# Patient Record
Sex: Male | Born: 1985 | Race: Black or African American | Hispanic: No | Marital: Single | Smoking: Current every day smoker
Health system: Southern US, Community
[De-identification: ages and names within clinical notes are randomized; demographics above are authoritative.]

## PROBLEM LIST (undated history)

## (undated) ENCOUNTER — Emergency Department (HOSPITAL_COMMUNITY): Payer: Medicare Other | Source: Home / Self Care

## (undated) DIAGNOSIS — H9190 Unspecified hearing loss, unspecified ear: Secondary | ICD-10-CM

## (undated) DIAGNOSIS — F1910 Other psychoactive substance abuse, uncomplicated: Secondary | ICD-10-CM

## (undated) DIAGNOSIS — F1010 Alcohol abuse, uncomplicated: Secondary | ICD-10-CM

## (undated) HISTORY — PX: KNEE SURGERY: SHX244

## (undated) HISTORY — PX: TESTICLE TORSION REDUCTION: SHX795

## (undated) HISTORY — PX: ABDOMINAL SURGERY: SHX537

---
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
## 2006-08-06 ENCOUNTER — Ambulatory Visit (HOSPITAL_COMMUNITY): Admission: EM | Admit: 2006-08-06 | Discharge: 2006-08-07 | Payer: Self-pay | Admitting: Emergency Medicine

## 2008-05-16 ENCOUNTER — Emergency Department (HOSPITAL_COMMUNITY): Admission: EM | Admit: 2008-05-16 | Discharge: 2008-05-16 | Payer: Self-pay | Admitting: Emergency Medicine

## 2009-02-02 ENCOUNTER — Emergency Department (HOSPITAL_COMMUNITY): Admission: EM | Admit: 2009-02-02 | Discharge: 2009-02-02 | Payer: Self-pay | Admitting: Family Medicine

## 2009-03-01 ENCOUNTER — Emergency Department (HOSPITAL_COMMUNITY): Admission: EM | Admit: 2009-03-01 | Discharge: 2009-03-01 | Payer: Self-pay | Admitting: Emergency Medicine

## 2009-05-27 ENCOUNTER — Encounter: Payer: Self-pay | Admitting: Emergency Medicine

## 2009-05-28 ENCOUNTER — Ambulatory Visit: Payer: Self-pay | Admitting: Psychiatry

## 2009-05-28 ENCOUNTER — Inpatient Hospital Stay (HOSPITAL_COMMUNITY): Admission: EM | Admit: 2009-05-28 | Discharge: 2009-05-31 | Payer: Self-pay | Admitting: Psychiatry

## 2009-06-23 ENCOUNTER — Emergency Department (HOSPITAL_COMMUNITY): Admission: EM | Admit: 2009-06-23 | Discharge: 2009-06-23 | Payer: Self-pay | Admitting: Emergency Medicine

## 2009-06-23 ENCOUNTER — Ambulatory Visit (HOSPITAL_COMMUNITY): Admission: RE | Admit: 2009-06-23 | Discharge: 2009-06-23 | Payer: Self-pay | Admitting: Psychiatry

## 2009-12-22 ENCOUNTER — Emergency Department (HOSPITAL_COMMUNITY): Admission: EM | Admit: 2009-12-22 | Discharge: 2009-12-22 | Payer: Self-pay | Admitting: Emergency Medicine

## 2009-12-26 ENCOUNTER — Inpatient Hospital Stay (HOSPITAL_COMMUNITY): Admission: AD | Admit: 2009-12-26 | Discharge: 2009-12-28 | Payer: Self-pay | Admitting: Urology

## 2009-12-26 ENCOUNTER — Emergency Department (HOSPITAL_COMMUNITY): Admission: EM | Admit: 2009-12-26 | Discharge: 2009-12-26 | Payer: Self-pay | Admitting: Family Medicine

## 2010-10-10 ENCOUNTER — Emergency Department (HOSPITAL_COMMUNITY)
Admission: EM | Admit: 2010-10-10 | Discharge: 2010-10-11 | Disposition: A | Discharge status: Home / Self Care | Payer: Medicare Other | Attending: Emergency Medicine | Admitting: Emergency Medicine

## 2010-10-10 DIAGNOSIS — H9190 Unspecified hearing loss, unspecified ear: Secondary | ICD-10-CM | POA: Insufficient documentation

## 2010-10-10 DIAGNOSIS — F1210 Cannabis abuse, uncomplicated: Secondary | ICD-10-CM | POA: Insufficient documentation

## 2010-10-10 DIAGNOSIS — F1910 Other psychoactive substance abuse, uncomplicated: Secondary | ICD-10-CM | POA: Insufficient documentation

## 2010-10-10 LAB — RAPID URINE DRUG SCREEN, HOSP PERFORMED
Barbiturates: NOT DETECTED
Cocaine: NOT DETECTED
Opiates: NOT DETECTED

## 2010-10-10 LAB — CBC
MCHC: 34.3 g/dL (ref 30.0–36.0)
Platelets: 149 10*3/uL — ABNORMAL LOW (ref 150–400)

## 2010-10-10 LAB — URINALYSIS, ROUTINE W REFLEX MICROSCOPIC
Ketones, ur: NEGATIVE mg/dL
Nitrite: NEGATIVE
Urobilinogen, UA: 0.2 mg/dL (ref 0.0–1.0)

## 2010-10-10 LAB — DIFFERENTIAL
Eosinophils Absolute: 0 10*3/uL (ref 0.0–0.7)
Lymphocytes Relative: 9 % — ABNORMAL LOW (ref 12–46)
Lymphs Abs: 1.3 10*3/uL (ref 0.7–4.0)
Monocytes Absolute: 0.8 10*3/uL (ref 0.1–1.0)
Monocytes Relative: 6 % (ref 3–12)
Neutro Abs: 11.9 10*3/uL — ABNORMAL HIGH (ref 1.7–7.7)

## 2010-10-10 LAB — BASIC METABOLIC PANEL
BUN: 14 mg/dL (ref 6–23)
CO2: 27 mEq/L (ref 19–32)
Calcium: 9.7 mg/dL (ref 8.4–10.5)
Chloride: 105 mEq/L (ref 96–112)
Creatinine, Ser: 1.44 mg/dL (ref 0.4–1.5)
GFR calc non Af Amer: >60 mL/min (ref >60)
Glucose, Bld: 88 mg/dL (ref 70–99)
Potassium: 4.3 mEq/L (ref 3.5–5.1)

## 2010-10-10 LAB — ETHANOL: Alcohol, Ethyl (B): <5 mg/dL (ref 0–10)

## 2010-11-04 ENCOUNTER — Emergency Department (HOSPITAL_COMMUNITY)
Admission: EM | Admit: 2010-11-04 | Discharge: 2010-11-04 | Disposition: A | Discharge status: Home / Self Care | Payer: Medicare Other | Attending: Emergency Medicine | Admitting: Emergency Medicine

## 2010-11-04 DIAGNOSIS — R369 Urethral discharge, unspecified: Secondary | ICD-10-CM | POA: Insufficient documentation

## 2010-11-04 DIAGNOSIS — N489 Disorder of penis, unspecified: Secondary | ICD-10-CM | POA: Insufficient documentation

## 2010-11-13 LAB — CBC
HCT: 41.2 % (ref 39.0–52.0)
Hemoglobin: 13.3 g/dL (ref 13.0–17.0)
MCHC: 32.2 g/dL (ref 30.0–36.0)
MCHC: 33.5 g/dL (ref 30.0–36.0)
MCV: 94.3 fL (ref 78.0–100.0)
RBC: 4.37 MIL/uL (ref 4.22–5.81)
RDW: 14.4 % (ref 11.5–15.5)
RDW: 14.2 % (ref 11.5–15.5)

## 2010-11-13 LAB — ANAEROBIC CULTURE

## 2010-11-13 LAB — DIFFERENTIAL
Basophils Relative: 0 % (ref 0–1)
Eosinophils Absolute: 0 10*3/uL (ref 0.0–0.7)
Eosinophils Relative: 0 % (ref 0–5)
Lymphs Abs: 0.9 10*3/uL (ref 0.7–4.0)
Neutro Abs: 16.2 10*3/uL — ABNORMAL HIGH (ref 1.7–7.7)
Neutrophils Relative %: 87 % — ABNORMAL HIGH (ref 43–77)

## 2010-11-13 LAB — POCT I-STAT, CHEM 8
Calcium, Ion: 1.17 mmol/L (ref 1.12–1.32)
Chloride: 105 mEq/L (ref 96–112)
HCT: 47 % (ref 39.0–52.0)
Sodium: 139 mEq/L (ref 135–145)
TCO2: 27 mmol/L (ref 0–100)

## 2010-11-13 LAB — URINALYSIS, ROUTINE W REFLEX MICROSCOPIC
Bilirubin Urine: NEGATIVE
Glucose, UA: NEGATIVE mg/dL
Hgb urine dipstick: NEGATIVE
Ketones, ur: NEGATIVE mg/dL
Protein, ur: NEGATIVE mg/dL
Specific Gravity, Urine: 1.024 (ref 1.005–1.030)
pH: 6 (ref 5.0–8.0)

## 2010-11-13 LAB — DRUG SCREEN PANEL (SERUM)

## 2010-11-13 LAB — WOUND CULTURE

## 2010-11-13 LAB — BASIC METABOLIC PANEL
BUN: 9 mg/dL (ref 6–23)
Potassium: 3.2 mEq/L — ABNORMAL LOW (ref 3.5–5.1)

## 2010-11-29 LAB — URINALYSIS, ROUTINE W REFLEX MICROSCOPIC
Bilirubin Urine: NEGATIVE
Glucose, UA: NEGATIVE mg/dL
Hgb urine dipstick: NEGATIVE
Ketones, ur: NEGATIVE mg/dL
Protein, ur: NEGATIVE mg/dL
Specific Gravity, Urine: 1.026 (ref 1.005–1.030)
Urobilinogen, UA: 1 mg/dL (ref 0.0–1.0)

## 2010-11-29 LAB — DIFFERENTIAL
Basophils Absolute: 0 10*3/uL (ref 0.0–0.1)
Basophils Relative: 0 % (ref 0–1)
Basophils Relative: 0 % (ref 0–1)
Eosinophils Absolute: 0 10*3/uL (ref 0.0–0.7)
Lymphocytes Relative: 1 % — ABNORMAL LOW (ref 12–46)
Lymphs Abs: 1.5 10*3/uL (ref 0.7–4.0)
Lymphs Abs: 0.3 10*3/uL — ABNORMAL LOW (ref 0.7–4.0)
Monocytes Absolute: 0.5 10*3/uL (ref 0.1–1.0)
Monocytes Relative: 11 % (ref 3–12)
Neutro Abs: 8.3 10*3/uL — ABNORMAL HIGH (ref 1.7–7.7)
Neutro Abs: 24.3 10*3/uL — ABNORMAL HIGH (ref 1.7–7.7)
Neutrophils Relative %: 75 % (ref 43–77)

## 2010-11-29 LAB — RAPID URINE DRUG SCREEN, HOSP PERFORMED
Amphetamines: NOT DETECTED
Barbiturates: NOT DETECTED
Cocaine: POSITIVE — AB
Opiates: NOT DETECTED
Opiates: NOT DETECTED
Tetrahydrocannabinol: POSITIVE — AB

## 2010-11-29 LAB — CBC
HCT: 47.2 % (ref 39.0–52.0)
HCT: 43.6 % (ref 39.0–52.0)
Hemoglobin: 15.6 g/dL (ref 13.0–17.0)
Hemoglobin: 14.7 g/dL (ref 13.0–17.0)
MCHC: 33 g/dL (ref 30.0–36.0)
MCHC: 33.7 g/dL (ref 30.0–36.0)
MCV: 92.4 fL (ref 78.0–100.0)
RBC: 4.72 MIL/uL (ref 4.22–5.81)
RDW: 13.9 % (ref 11.5–15.5)
RDW: 14.5 % (ref 11.5–15.5)

## 2010-11-29 LAB — CULTURE, BLOOD (ROUTINE X 2): Culture: NO GROWTH

## 2010-11-29 LAB — BASIC METABOLIC PANEL
CO2: 27 mEq/L (ref 19–32)
Calcium: 9.6 mg/dL (ref 8.4–10.5)
Chloride: 100 mEq/L (ref 96–112)
Glucose, Bld: 107 mg/dL — ABNORMAL HIGH (ref 70–99)
Sodium: 135 mEq/L (ref 135–145)

## 2010-11-29 LAB — COMPREHENSIVE METABOLIC PANEL
Alkaline Phosphatase: 51 U/L (ref 39–117)
BUN: 8 mg/dL (ref 6–23)
CO2: 30 mEq/L (ref 19–32)
Calcium: 9.3 mg/dL (ref 8.4–10.5)
GFR calc non Af Amer: >60 mL/min (ref >60)
Glucose, Bld: 86 mg/dL (ref 70–99)
Total Protein: 8.1 g/dL (ref 6.0–8.3)

## 2010-12-02 LAB — RAPID URINE DRUG SCREEN, HOSP PERFORMED
Amphetamines: NOT DETECTED
Benzodiazepines: NOT DETECTED
Cocaine: POSITIVE — AB

## 2010-12-02 LAB — POCT I-STAT, CHEM 8
Creatinine, Ser: 1.4 mg/dL (ref 0.4–1.5)
Glucose, Bld: 130 mg/dL — ABNORMAL HIGH (ref 70–99)
HCT: 52 % (ref 39.0–52.0)
Hemoglobin: 17.7 g/dL — ABNORMAL HIGH (ref 13.0–17.0)
Potassium: 3.3 mEq/L — ABNORMAL LOW (ref 3.5–5.1)
Sodium: 140 mEq/L (ref 135–145)
TCO2: 27 mmol/L (ref 0–100)

## 2010-12-02 LAB — ETHANOL: Alcohol, Ethyl (B): <5 mg/dL (ref 0–10)

## 2010-12-03 LAB — CBC
Hemoglobin: 16.2 g/dL (ref 13.0–17.0)
MCHC: 33.2 g/dL (ref 30.0–36.0)
RBC: 5.26 MIL/uL (ref 4.22–5.81)
RDW: 14.3 % (ref 11.5–15.5)

## 2010-12-03 LAB — POCT I-STAT, CHEM 8
BUN: 13 mg/dL (ref 6–23)
Chloride: 103 mEq/L (ref 96–112)
HCT: 54 % — ABNORMAL HIGH (ref 39.0–52.0)
Potassium: 4.1 mEq/L (ref 3.5–5.1)

## 2010-12-03 LAB — DIFFERENTIAL
Basophils Absolute: 0 10*3/uL (ref 0.0–0.1)
Basophils Relative: 0 % (ref 0–1)
Lymphocytes Relative: 4 % — ABNORMAL LOW (ref 12–46)
Neutro Abs: 6.4 10*3/uL (ref 1.7–7.7)
Neutrophils Relative %: 86 % — ABNORMAL HIGH (ref 43–77)

## 2011-01-11 NOTE — Op Note (Signed)
NAMEMarland Kitchen  Matthew, Reeves NO.:  [id number]   MEDICAL RECORD NO.:  [id number]          PATIENT TYPE:  EMS   LOCATION:  ED                           FACILITY:  [facility]   PHYSICIAN:  Martina Sinner, MD DATE OF BIRTH:  [birth year]   DATE OF PROCEDURE:  08/06/2006  DATE OF DISCHARGE:                               OPERATIVE REPORT   PREOPERATIVE DIAGNOSIS:  Right testicular torsion.   POSTOPERATIVE DIAGNOSIS:  Right testicular torsion.   SURGERY:  Scrotal exploration and bilateral orchiopexy.   SURGEON:  Martina Sinner, MD   ASSISTANT:  Terie Purser, MD   Matthew Reeves is a 25 year old gentleman who had an acute onset of right  testicular pain.  His guardian helped in my assessment of him.  He may  have had a past history of scrotal pain.  He had a positive testicular  ultrasound with no flow to the right testicle.   Patient was prepped and draped in the usual fashion.  He was given  preoperative Ancef.  A midline incision of approximately 4.5 cm was  made, and the right testicle was delivered.  The testicle was healthy-  looking and was twisted approximately 180 degrees.  Based upon the  physical examination under anesthesia, I think he had some change of  partial detorsion.  A deep scrotal pocket was made, and 4-0 Prolene was  used for the orchiopexy.  A three quadrant pexy was performed by passing  a suture through the dartos deep in the pouch and through the albuginea.  We tried to pass the suture through the tunica albuginea and not having  contact with the seminiferous tubules.   Through the same midline incision, the left testicle was delivered by  sharply dissecting down through the dartos and then tunica vaginalis.  The testicle was normal.  The same procedure was utilized on the left  side, and then we did an orchiopexy.   The appendix epididymis was fulgurated, removed on both sides, and was  very edematous on the right side.  Hemostasis was excellent.   Irrigation  was utilized.  The dartos, including the midline raphe, was closed with  a running 3-0 Vicryl, and 4-0 Monocryl was used to close the skin in a  running fashion.  Pressure dressing was applied with Telfa and Steri-  Strips.   The procedure went very well, and hopefully the patient will recuperate  quickly and be sent home tomorrow.           ______________________________  Martina Sinner, MD  Electronically Signed    SAM/MEDQ  D:  08/06/2006  T:  08/06/2006  Job:  [id number]
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
## 2011-01-11 NOTE — Discharge Summary (Signed)
[facility]  Patient:    Matthew Reeves, Matthew Reeves Visit Number: [id number] MRN: [id number]          Service Type: PSY Location: 20 0203 01 Attending Physician:  Veneta Penton. Dictated by:   Veneta Penton, M.D. Admit Date:  05/15/2001 Discharge Date: 05/22/2001                             Discharge Summary  REASON FOR ADMISSION:  This 25 year old black male was admitted complaining of depression with suicidal ideation with a plan to shoot himself with a gun which he had access to.  For further history of present illness, please see the patients psychiatric admission assessment.  PHYSICAL EXAMINATION AT THE TIME OF ADMISSION:  Bilateral hearing loss and a possibly microcephaly.  He also had a past history of abdominal surgery as an infant, possibly an pyloric sphincter repair.  He had an otherwise unremarkable physical examination.  LABORATORY EXAMINATION:  The patient underwent a laboratory workup to rule out any medical problems contributing to his symptomatology.  CBC showed RDW 13.8% and was otherwise unremarkable.  Urine drug screen was negative.  Thyroid function tests showed TSH 1, T4 0.76, and was otherwise unremarkable.  Hepatic panel was within normal limits.  GGT was within normal limits.  UA was unremarkable.  RPR was nonreactive.  Urine probe for gonorrhea and chlamydia were negative.  The patient received no x-rays, no special procedures, no additional consultations.  He sustained no complications during the course of this hospitalization.  HOSPITAL COURSE:  On admission, the patient was oppositional, defiant, negativistic, hostile.  Affect and mood were depressed, irritable, and angry. Concentration was decreased.  He displayed poor impulse control.  He denied any auditory or visual hallucinations, had been taking Risperdal for a history of this approximately a year ago and reported that his symptoms of psychosis had been in  remission since starting Risperdal.  He also was admitted on Effexor XR at 150 mg p.o. q.h.s.  This was titrated up to 225 mg p.o. q.d. in an attempt to improve his symptoms of depression.  At the time of discharge, the patient denies any homicidal or suicidal ideation.  Affect and mood have dramatically improved.  He is no longer showing any significant oppositional or defiant behavior.  He no longer appears to be a danger to himself or others, denies any homicidal or suicidal ideation.  He has displayed no evidence of a thought disorder throughout his hospital course.  His condition on discharge is improved.  He is motivated for outpatient therapy. Consequently, it is felt he has reached his maximum benefits of hospitalization and is ready for discharge to a less restrictive alternative setting.  CONDITION ON DISCHARGE:  Improved.  DIAGNOSES: Axis I:    1. Major depression, recurrent type, severe without psychosis.            2. Conduct disorder.            3. Psychotic disorder, not otherwise specified, in remission.            4. Rule out oppositional defiant disorder. Axis II:   Rule out learning disorder, not otherwise specified. Axis III:  Profound hearing loss. Axis IV:   Current psychosocial stressors are severe. Axis V:    20 on admission, 30 on discharge.  FURTHER EVALUATION AND TREATMENT RECOMMENDATIONS: 1. The patient is discharged to home. 2. He is discharged on an  unrestricted level of activity and a regular diet. 3. He will follow up with his outpatient psychiatrist for all further aspects    of his psychiatric care.  DISCHARGE MEDICATIONS: 1. Effexor XR 225 mg p.o. q.a.m. 2. Risperdal 2 mg p.o. q.h.s. Dictated by:   Veneta Penton, M.D. Attending Physician:  Veneta Penton DD:  05/22/01 TD:  05/22/01 Job: [id number]

## 2012-07-17 ENCOUNTER — Emergency Department (HOSPITAL_COMMUNITY)
Admission: EM | Admit: 2012-07-17 | Discharge: 2012-07-18 | Disposition: A | Discharge status: Home / Self Care | Payer: Medicare Other | Attending: Emergency Medicine | Admitting: Emergency Medicine

## 2012-07-17 ENCOUNTER — Emergency Department (HOSPITAL_COMMUNITY): Payer: Medicare Other

## 2012-07-17 ENCOUNTER — Other Ambulatory Visit: Payer: Self-pay

## 2012-07-17 ENCOUNTER — Encounter (HOSPITAL_COMMUNITY): Payer: Self-pay

## 2012-07-17 DIAGNOSIS — R112 Nausea with vomiting, unspecified: Secondary | ICD-10-CM | POA: Insufficient documentation

## 2012-07-17 DIAGNOSIS — F17200 Nicotine dependence, unspecified, uncomplicated: Secondary | ICD-10-CM | POA: Insufficient documentation

## 2012-07-17 DIAGNOSIS — R51 Headache: Secondary | ICD-10-CM | POA: Insufficient documentation

## 2012-07-17 DIAGNOSIS — H9190 Unspecified hearing loss, unspecified ear: Secondary | ICD-10-CM | POA: Insufficient documentation

## 2012-07-17 HISTORY — DX: Unspecified hearing loss, unspecified ear: H91.90

## 2012-07-17 LAB — CBC WITH DIFFERENTIAL/PLATELET
Basophils Absolute: 0 10*3/uL (ref 0.0–0.1)
Eosinophils Absolute: 0 10*3/uL (ref 0.0–0.7)
Eosinophils Relative: 0 % (ref 0–5)
MCH: 29.5 pg (ref 26.0–34.0)
MCV: 87.8 fL (ref 78.0–100.0)
Platelets: 144 10*3/uL — ABNORMAL LOW (ref 150–400)
RDW: 14 % (ref 11.5–15.5)

## 2012-07-17 LAB — BASIC METABOLIC PANEL
Calcium: 10.3 mg/dL (ref 8.4–10.5)
Creatinine, Ser: 1.06 mg/dL (ref 0.50–1.35)
GFR calc non Af Amer: >90 mL/min (ref >90)
Glucose, Bld: 85 mg/dL (ref 70–99)
Sodium: 139 mEq/L (ref 135–145)

## 2012-07-17 MED ORDER — ONDANSETRON 4 MG PO TBDP
ORAL_TABLET | ORAL | Status: AC
  Administered 2012-07-17: 4 mg via ORAL
  Filled 2012-07-17: qty 1

## 2012-07-17 MED ORDER — ONDANSETRON 4 MG PO TBDP
4.0000 mg | ORAL_TABLET | Freq: Once | ORAL | Status: AC
  Administered 2012-07-17: 4 mg via ORAL

## 2012-07-17 MED ORDER — METOCLOPRAMIDE HCL 5 MG/ML IJ SOLN
10.0000 mg | Freq: Once | INTRAMUSCULAR | Status: AC
  Administered 2012-07-17: 10 mg via INTRAVENOUS
  Filled 2012-07-17: qty 2

## 2012-07-17 MED ORDER — LORAZEPAM 2 MG/ML IJ SOLN
2.0000 mg | Freq: Once | INTRAMUSCULAR | Status: AC
  Administered 2012-07-17: 2 mg via INTRAVENOUS
  Filled 2012-07-17: qty 1

## 2012-07-17 MED ORDER — SODIUM CHLORIDE 0.9 % IV BOLUS (SEPSIS)
1000.0000 mL | Freq: Once | INTRAVENOUS | Status: AC
  Administered 2012-07-17: 1000 mL via INTRAVENOUS

## 2012-07-17 MED ORDER — DIPHENHYDRAMINE HCL 50 MG/ML IJ SOLN
25.0000 mg | Freq: Once | INTRAMUSCULAR | Status: AC
  Administered 2012-07-17: 25 mg via INTRAVENOUS
  Filled 2012-07-17: qty 1

## 2012-07-17 NOTE — ED Notes (Signed)
Pt requesting to leave.  Pt unable to complete CT scan of head d/t anxiety,.  Offered pt meds to assist with anxiety and pt refusing.  Sister at bedside attempting to calm patient and get pt to agree.  PA notified.

## 2012-07-17 NOTE — ED Provider Notes (Signed)
History     CSN: [id number]  Arrival date & time 07/17/12  4098   First MD Initiated Contact with Patient 07/17/12 2055      Chief Complaint  Patient presents with  . Emesis  . Headache    (Consider location/radiation/quality/duration/timing/severity/associated sxs/prior treatment) HPI Comments: Patient is a 26 year old male who presents with a headache for 3 days. Patient reports a sudden onset and progressive worsening of the headache. The pain is sharp, constant and is located in generalized head without radiation. Patient has tried nothing for symptoms without relief. No alleviating/aggravating factors. Patient reports associated nausea and vomiting. Patient denies fever, neck pain/stiffness,  diarrhea, numbness/tingling, weakness, visual changes, congestion, abdominal pain. No head trauma.     Patient is a 26 y.o. male presenting with vomiting and headaches.  Emesis  Associated symptoms include headaches.  Headache  Associated symptoms include nausea and vomiting.    Past Medical History  Diagnosis Date  . Deaf     Past Surgical History  Procedure Date  . Abdominal surgery     at birth  . Testicle torsion reduction   . Knee surgery     History reviewed. No pertinent family history.  History  Substance Use Topics  . Smoking status: Current Every Day Smoker -- 0.5 packs/day    Types: Cigarettes  . Smokeless tobacco: Not on file  . Alcohol Use: No      Review of Systems  Gastrointestinal: Positive for nausea and vomiting.  Neurological: Positive for headaches.  All other systems reviewed and are negative.    Allergies  Hydrocodone  Home Medications   Current Outpatient Rx  Name  Route  Sig  Dispense  Refill  . IBUPROFEN 200 MG PO TABS   Oral   Take 200 mg by mouth every 6 (six) hours as needed. Pain           BP 108/55  Pulse 53  Temp 98 F (36.7 C) (Oral)  Resp 12  SpO2 99%  Physical Exam  Nursing note and vitals  reviewed. Constitutional: He is oriented to person, place, and time. He appears well-developed and well-nourished. No distress.  HENT:  Head: Normocephalic and atraumatic.  Nose: Nose normal.  Mouth/Throat: Oropharynx is clear and moist. No oropharyngeal exudate.  Eyes: Conjunctivae normal and EOM are normal. Pupils are equal, round, and reactive to light. No scleral icterus.  Neck: Normal range of motion. Neck supple.       No meningeal signs.  Cardiovascular: Normal rate and regular rhythm.  Exam reveals no gallop and no friction rub.   No murmur heard. Pulmonary/Chest: Effort normal and breath sounds normal. He has no wheezes. He has no rales. He exhibits no tenderness.  Abdominal: Soft. He exhibits no distension. There is no tenderness. There is no rebound and no guarding.  Musculoskeletal: Normal range of motion.  Neurological: He is alert and oriented to person, place, and time. No cranial nerve deficit. Coordination normal.       Strength and sensation equal and intact bilaterally. Communication is goal-oriented (Patient uses sign language for communication). Moves limbs without ataxia.   Skin: Skin is warm and dry. He is not diaphoretic.  Psychiatric: He has a normal mood and affect. His behavior is normal.    ED Course  Procedures (including critical care time)   Date: 07/18/2012  Rate: 54  Rhythm: normal sinus rhythm  QRS Axis: normal  Intervals: normal  ST/T Wave abnormalities: normal  Conduction Disutrbances:none  Narrative Interpretation: NSR  Old EKG Reviewed: none available    Labs Reviewed  CBC WITH DIFFERENTIAL - Abnormal; Notable for the following:    WBC 14.9 (*)     Platelets 144 (*)     Neutrophils Relative 92 (*)     Neutro Abs 13.7 (*)     Lymphocytes Relative 4 (*)     All other components within normal limits  BASIC METABOLIC PANEL   Ct Head Wo Contrast  07/17/2012  *RADIOLOGY REPORT*  Clinical Data: Headaches, vomiting, and chest pain.  CT HEAD  WITHOUT CONTRAST  Technique:  Contiguous axial images were obtained from the base of the skull through the vertex without contrast.  Comparison: None.  Findings: The ventricles and sulci are symmetrical without significant effacement, displacement, or dilatation. No mass effect or midline shift. No abnormal extra-axial fluid collections. The grey-white matter junction is distinct. Basal cisterns are not effaced. No acute intracranial hemorrhage. No depressed skull fractures.  Visualized paranasal sinuses and mastoid air cells are not opacified.  IMPRESSION: No acute intracranial abnormalities.   Original Report Authenticated By: Burman Nieves, M.D.      1. Headache       MDM  10:06 PM Labs unremarkable. Patient given reglan and benadryl for headache. Patient will have a CT of head.   11:03 PM CT head unremarkable. Dr. Rhunette Croft saw the patient who is comfortable sending the patient home. No further evaluation needed at this time. Patient instructed to return with worsening or concerning symptoms.       Emilia Beck, [place] 07/18/12 [phone]

## 2012-07-17 NOTE — ED Notes (Signed)
Pt convinced for anxiety med.  Med given.

## 2012-07-17 NOTE — ED Notes (Signed)
Pt c/o headaches, vomiting, chest pain since today. Pt c/o abdominal pain and chest pain at present. Pt unable to rate pain. Pt vomited in triage. Pt BIB his sister.

## 2012-07-18 MED ORDER — PROMETHAZINE HCL 25 MG PO TABS: 25.0000 mg | ORAL_TABLET | Freq: Four times a day (QID) | ORAL | Status: DC | PRN

## 2012-07-18 MED ORDER — IBUPROFEN 800 MG PO TABS: 800.0000 mg | ORAL_TABLET | Freq: Three times a day (TID) | ORAL | Status: DC

## 2012-07-18 NOTE — ED Notes (Signed)
MD at bedside. 

## 2012-07-19 NOTE — ED Provider Notes (Signed)
Medical screening examination/treatment/procedure(s) were conducted as a shared visit with non-physician practitioner(s) and myself.  I personally evaluated the patient during the encounter.  Pt comes in with cc of headaches x 3 days, with some nausea and emesis. Neuro exam is benign. No true risk factors for [facility]. Discussed with the patient and the family that although CT head was normal, we can definitively r/o SAH only via LP and CSF evaluation - which they declined.  Patient discharged with appropriate d/c instructions and warnings.  Derwood Kaplan, MD 07/19/12 [phone]

## 2013-12-15 ENCOUNTER — Emergency Department (HOSPITAL_COMMUNITY)
Admission: EM | Admit: 2013-12-15 | Discharge: 2013-12-15 | Disposition: A | Discharge status: Home / Self Care | Payer: Medicare Other | Attending: Emergency Medicine | Admitting: Emergency Medicine

## 2013-12-15 ENCOUNTER — Emergency Department (HOSPITAL_COMMUNITY): Payer: Medicare Other

## 2013-12-15 ENCOUNTER — Encounter (HOSPITAL_COMMUNITY): Payer: Self-pay | Admitting: Emergency Medicine

## 2013-12-15 DIAGNOSIS — S99919A Unspecified injury of unspecified ankle, initial encounter: Principal | ICD-10-CM

## 2013-12-15 DIAGNOSIS — Y9302 Activity, running: Secondary | ICD-10-CM | POA: Insufficient documentation

## 2013-12-15 DIAGNOSIS — H9190 Unspecified hearing loss, unspecified ear: Secondary | ICD-10-CM | POA: Insufficient documentation

## 2013-12-15 DIAGNOSIS — R296 Repeated falls: Secondary | ICD-10-CM | POA: Insufficient documentation

## 2013-12-15 DIAGNOSIS — S99929A Unspecified injury of unspecified foot, initial encounter: Principal | ICD-10-CM

## 2013-12-15 DIAGNOSIS — Y9289 Other specified places as the place of occurrence of the external cause: Secondary | ICD-10-CM | POA: Insufficient documentation

## 2013-12-15 DIAGNOSIS — Z9889 Other specified postprocedural states: Secondary | ICD-10-CM | POA: Insufficient documentation

## 2013-12-15 DIAGNOSIS — Z791 Long term (current) use of non-steroidal anti-inflammatories (NSAID): Secondary | ICD-10-CM | POA: Insufficient documentation

## 2013-12-15 DIAGNOSIS — F17200 Nicotine dependence, unspecified, uncomplicated: Secondary | ICD-10-CM | POA: Insufficient documentation

## 2013-12-15 DIAGNOSIS — S8990XA Unspecified injury of unspecified lower leg, initial encounter: Secondary | ICD-10-CM | POA: Insufficient documentation

## 2013-12-15 DIAGNOSIS — M79673 Pain in unspecified foot: Secondary | ICD-10-CM

## 2013-12-15 MED ORDER — TRAMADOL HCL 50 MG PO TABS: 50.0000 mg | ORAL_TABLET | Freq: Four times a day (QID) | ORAL | Status: DC | PRN

## 2013-12-15 MED ORDER — TRAMADOL HCL 50 MG PO TABS
50.0000 mg | ORAL_TABLET | Freq: Once | ORAL | Status: AC
  Administered 2013-12-15: 50 mg via ORAL
  Filled 2013-12-15: qty 1

## 2013-12-15 NOTE — ED Provider Notes (Signed)
Medical screening examination/treatment/procedure(s) were performed by non-physician practitioner and as supervising physician I was immediately available for consultation/collaboration.   EKG Interpretation None       Raeford RazorStephen Kerrie Latour, MD 12/15/13 2336

## 2013-12-15 NOTE — ED Provider Notes (Signed)
CSN: [id number]     Arrival date & time 12/15/13  1727 History  This chart was scribed for non-physician practitioner, Carl LowerVrinda Meia Emley, NP, working with No att. providers found by Charline BillsEssence Howell, ED Scribe. This patient was seen in room WTR8/WTR8 and the patient's care was started at 5:37 PM.    No chief complaint on file.   The history is provided by the patient. No language interpreter was used.   HPI Comments: Carl Orr is a 28 y.o. male who presents to the Emergency Department complaining of L foot pain. Pt states that he was running and hopped a fence when he landed and hurt his foot. Pt has taken 2 Tylenol capsules for relief. He states that his pain is worse with walking. He walks on his toes due to severity of pain.   Past Medical History  Diagnosis Date  . Deaf    Past Surgical History  Procedure Laterality Date  . Abdominal surgery      at birth  . Testicle torsion reduction    . Knee surgery     No family history on file. History  Substance Use Topics  . Smoking status: Current Every Day Smoker -- 0.50 packs/day    Types: Cigarettes  . Smokeless tobacco: Not on file  . Alcohol Use: No    Review of Systems  Musculoskeletal: Positive for myalgias.  All other systems reviewed and are negative.   Allergies  Hydrocodone  Home Medications   Prior to Admission medications   Medication Sig Start Date End Date Taking? Authorizing Provider  ibuprofen (ADVIL,MOTRIN) 200 MG tablet Take 200 mg by mouth every 6 (six) hours as needed. Pain    Historical Provider, MD  ibuprofen (ADVIL,MOTRIN) 800 MG tablet Take 1 tablet (800 mg total) by mouth 3 (three) times daily. 07/18/12   Kaitlyn Szekalski, PA-C  promethazine (PHENERGAN) 25 MG tablet Take 1 tablet (25 mg total) by mouth every 6 (six) hours as needed for nausea. 07/18/12   Emilia BeckKaitlyn Szekalski, PA-C   Triage Vitals: BP 111/58  Pulse 89  Temp(Src) 98.8 F (37.1 C) (Oral)  Resp 16  SpO2 98% Physical Exam  Nursing  note and vitals reviewed. Constitutional: He is oriented to person, place, and time. He appears well-developed and well-nourished.  HENT:  Head: Normocephalic and atraumatic.  Eyes: EOM are normal.  Neck: Neck supple.  Cardiovascular: Normal rate.   Pulmonary/Chest: Effort normal.  Musculoskeletal: Normal range of motion.  Tenderness across the bottom of L foot No swelling  No redness Full ROM  Neurological: He is alert and oriented to person, place, and time.  Skin: Skin is warm and dry.  Psychiatric: He has a normal mood and affect. His behavior is normal.    ED Course  Procedures (including critical care time) DIAGNOSTIC STUDIES: Oxygen Saturation is 98% on RA, normal by my interpretation.    COORDINATION OF CARE: 5:39 PM-Discussed treatment plan which includes XR with pt at bedside and pt agreed to plan.   Labs Review Labs Reviewed - No data to display  Imaging Review Dg Foot Complete Left  12/15/2013   CLINICAL DATA:  Left foot pain.  Foot trauma.  EXAM: LEFT FOOT - COMPLETE 3+ VIEW  COMPARISON:  None.  FINDINGS: Anatomic alignment of the left foot. There is no fracture. Soft tissues appear within normal limits.  IMPRESSION: Negative.   Electronically Signed   By: Andreas NewportGeoffrey  Lamke M.D.   On: 12/15/2013 18:21     EKG Interpretation None  MDM   Final diagnoses:  Foot pain    No acute bony abnormality noted. Will send home with ultram and referral to ortho  I personally performed the services described in this documentation, which was scribed in my presence. The recorded information has been reviewed and is accurate.    Carl LowerVrinda Derrin Currey, NP 12/15/13 [phone]

## 2013-12-15 NOTE — Discharge Instructions (Signed)
Musculoskeletal Pain °Musculoskeletal pain is muscle and boney aches and pains. These pains can occur in any part of the body. Your caregiver may treat you without knowing the cause of the pain. They may treat you if blood or urine tests, X-rays, and other tests were normal.  °CAUSES °There is often not a definite cause or reason for these pains. These pains may be caused by a type of germ (virus). The discomfort may also come from overuse. Overuse includes working out too hard when your body is not fit. Boney aches also come from weather changes. Bone is sensitive to atmospheric pressure changes. °HOME CARE INSTRUCTIONS  °· Ask when your test results will be ready. Make sure you get your test results. °· Only take over-the-counter or prescription medicines for pain, discomfort, or fever as directed by your caregiver. If you were given medications for your condition, do not drive, operate machinery or power tools, or sign legal documents for 24 hours. Do not drink alcohol. Do not take sleeping pills or other medications that may interfere with treatment. °· Continue all activities unless the activities cause more pain. When the pain lessens, slowly resume normal activities. Gradually increase the intensity and duration of the activities or exercise. °· During periods of severe pain, bed rest may be helpful. Lay or sit in any position that is comfortable. °· Putting ice on the injured area. °· Put ice in a bag. °· Place a towel between your skin and the bag. °· Leave the ice on for 15 to 20 minutes, 3 to 4 times a day. °· Follow up with your caregiver for continued problems and no reason can be found for the pain. If the pain becomes worse or does not go away, it may be necessary to repeat tests or do additional testing. Your caregiver may need to look further for a possible cause. °SEEK IMMEDIATE MEDICAL CARE IF: °· You have pain that is getting worse and is not relieved by medications. °· You develop chest pain  that is associated with shortness or breath, sweating, feeling sick to your stomach (nauseous), or throw up (vomit). °· Your pain becomes localized to the abdomen. °· You develop any new symptoms that seem different or that concern you. °MAKE SURE YOU:  °· Understand these instructions. °· Will watch your condition. °· Will get help right away if you are not doing well or get worse. °Document Released: 08/12/2005 Document Revised: 11/04/2011 Document Reviewed: 04/16/2013 °Exit[employer]® Patient Information ©2014 [employer]. ° °

## 2013-12-15 NOTE — ED Notes (Signed)
Pt c/o L foot pain. Pt sts that he was running and hopped a fence and when he landed hurt his L foot. Pt c/o 10/10 pain. A&Ox4. Pt deaf.

## 2014-01-28 ENCOUNTER — Emergency Department (HOSPITAL_COMMUNITY)
Admission: EM | Admit: 2014-01-28 | Discharge: 2014-01-28 | Disposition: A | Discharge status: Home / Self Care | Payer: Medicare Other | Attending: Emergency Medicine | Admitting: Emergency Medicine

## 2014-01-28 ENCOUNTER — Encounter (HOSPITAL_COMMUNITY): Payer: Self-pay | Admitting: Emergency Medicine

## 2014-01-28 ENCOUNTER — Emergency Department (HOSPITAL_COMMUNITY): Payer: Medicare Other

## 2014-01-28 DIAGNOSIS — F17200 Nicotine dependence, unspecified, uncomplicated: Secondary | ICD-10-CM | POA: Insufficient documentation

## 2014-01-28 DIAGNOSIS — W268XXA Contact with other sharp object(s), not elsewhere classified, initial encounter: Secondary | ICD-10-CM | POA: Insufficient documentation

## 2014-01-28 DIAGNOSIS — Y92009 Unspecified place in unspecified non-institutional (private) residence as the place of occurrence of the external cause: Secondary | ICD-10-CM | POA: Insufficient documentation

## 2014-01-28 DIAGNOSIS — Y9389 Activity, other specified: Secondary | ICD-10-CM | POA: Insufficient documentation

## 2014-01-28 DIAGNOSIS — H9190 Unspecified hearing loss, unspecified ear: Secondary | ICD-10-CM | POA: Insufficient documentation

## 2014-01-28 DIAGNOSIS — S51809A Unspecified open wound of unspecified forearm, initial encounter: Secondary | ICD-10-CM | POA: Insufficient documentation

## 2014-01-28 DIAGNOSIS — Z23 Encounter for immunization: Secondary | ICD-10-CM | POA: Insufficient documentation

## 2014-01-28 DIAGNOSIS — S41119A Laceration without foreign body of unspecified upper arm, initial encounter: Secondary | ICD-10-CM

## 2014-01-28 DIAGNOSIS — Z885 Allergy status to narcotic agent status: Secondary | ICD-10-CM | POA: Insufficient documentation

## 2014-01-28 MED ORDER — TETANUS-DIPHTH-ACELL PERTUSSIS 5-2.5-18.5 LF-MCG/0.5 IM SUSP
0.5000 mL | Freq: Once | INTRAMUSCULAR | Status: AC
  Administered 2014-01-28: 0.5 mL via INTRAMUSCULAR
  Filled 2014-01-28: qty 0.5

## 2014-01-28 MED ORDER — OXYCODONE-ACETAMINOPHEN 5-325 MG PO TABS
1.0000 | ORAL_TABLET | Freq: Once | ORAL | Status: AC
  Administered 2014-01-28: 1 via ORAL
  Filled 2014-01-28: qty 1

## 2014-01-28 NOTE — Discharge Instructions (Signed)
Read the information below.  You may return to the Emergency Department at any time for worsening condition or any new symptoms that concern you.  If you develop redness, swelling, pus draining from the wound, or fevers greater than 100.4, return to the ER immediately for a recheck.   °

## 2014-01-28 NOTE — ED Notes (Signed)
Per mother, patient came home this morning and told her he busted a window. Has a laceration to the left lower forearm area. Bleeding controlled at this time.

## 2014-01-28 NOTE — ED Notes (Signed)
Emily, PA at the bedside . 

## 2014-01-28 NOTE — ED Provider Notes (Signed)
Medical screening examination/treatment/procedure(s) were performed by non-physician practitioner and as supervising physician I was immediately available for consultation/collaboration.     Aeon Koors, MD 01/28/14 1600 

## 2014-01-28 NOTE — ED Provider Notes (Signed)
CSN: [id number]     Arrival date & time 01/28/14  0728 History   First MD Initiated Contact with Patient 01/28/14 [phone]     Chief Complaint  Patient presents with  . Extremity Laceration     (Consider location/radiation/quality/duration/timing/severity/associated sxs/prior Treatment) The history is provided by the patient.    Patient brought in by sister with laceration to left forearm.  Per sister, patient states he was at a friend's house and cut his arm on a window.  Pt states he punched through a window and was trying to unlock it from the outside.  Pt denies weakness or numbness of the left hand.  Denies other injury.  Unsure of last Tdap. Denies that he did this to himself intentionally or that anyone else cut him.    Past Medical History  Diagnosis Date  . Deaf    Past Surgical History  Procedure Laterality Date  . Abdominal surgery      at birth  . Testicle torsion reduction    . Knee surgery     No family history on file. History  Substance Use Topics  . Smoking status: Current Every Day Smoker -- 0.50 packs/day    Types: Cigarettes  . Smokeless tobacco: Not on file  . Alcohol Use: No    Review of Systems  Constitutional: Negative for fever.  Skin: Positive for wound. Negative for color change.  Allergic/Immunologic: Negative for immunocompromised state.  Neurological: Negative for weakness and numbness.  Hematological: Does not bruise/bleed easily.  Psychiatric/Behavioral: Negative for suicidal ideas.      Allergies  Hydrocodone  Home Medications   Prior to Admission medications   Medication Sig Start Date End Date Taking? Authorizing Provider  ibuprofen (ADVIL,MOTRIN) 200 MG tablet Take 200 mg by mouth every 6 (six) hours as needed. Pain    Historical Provider, MD  ibuprofen (ADVIL,MOTRIN) 800 MG tablet Take 1 tablet (800 mg total) by mouth 3 (three) times daily. 07/18/12   Kaitlyn Szekalski, PA-C  promethazine (PHENERGAN) 25 MG tablet Take 1 tablet (25  mg total) by mouth every 6 (six) hours as needed for nausea. 07/18/12   Kaitlyn Szekalski, PA-C  traMADol (ULTRAM) 50 MG tablet Take 1 tablet (50 mg total) by mouth every 6 (six) hours as needed. 12/15/13   Teressa LowerVrinda Pickering, NP   BP 123/82  Pulse 64  Temp(Src) 97.8 F (36.6 C) (Oral)  Resp 18  Ht 5\' 7"  (1.702 m)  Wt 100 lb (45.36 kg)  BMI 15.66 kg/m2  SpO2 99% Physical Exam  Nursing note and vitals reviewed. Constitutional: He appears well-developed and well-nourished. No distress.  HENT:  Head: Normocephalic and atraumatic.  Neck: Neck supple.  Pulmonary/Chest: Effort normal.  Musculoskeletal:  Curvilinear laceration to anterior distal forearm, ulnar aspect.  Full AROM of all hand and wrist, strength 5/5, sensation intact, capillary refill < 2 seconds throughout.   Neurological: He is alert.  Skin: He is not diaphoretic.    ED Course  Procedures (including critical care time) Labs Review Labs Reviewed - No data to display  Imaging Review Dg Forearm Left  01/28/2014   CLINICAL DATA:  EXTREMITY LACERATION ?FB  EXAM: LEFT FOREARM - 2 VIEW  COMPARISON:  None.  FINDINGS: There is no evidence of fracture or other focal bone lesions. Soft tissue laceration distal ulnar aspect of the forearm. No radiopaque foreign bodies appreciated.  IMPRESSION: Soft tissue laceration otherwise Negative.   Electronically Signed   By: Salome HolmesHector  Cooper M.D.   On: 01/28/2014  08:16     EKG Interpretation None      7:43 AM Sister states patient can take oxycodone without problems or allergic reaction.   LACERATION REPAIR Performed by: Trixie Dredge Authorized by: Trixie Dredge Consent: Verbal consent obtained. Risks and benefits: risks, benefits and alternatives were discussed Consent given by: patient Patient identity confirmed: provided demographic data Prepped and Draped in normal sterile fashion Wound explored  Laceration Location: left forearm  Laceration Length: 5cm  No Foreign Bodies seen or  palpated  Anesthesia: local infiltration  Local anesthetic: lidocaine 2% with epinephrine  Anesthetic total: 3 ml  Irrigation method: syringe Amount of cleaning: standard  Skin closure: 4-0 vicryl  Number of sutures: 10  Technique: simple interrupted  Patient tolerance: Patient tolerated the procedure well with no immediate complications.   MDM   Final diagnoses:  Laceration of arm    Accidental laceration to left forearm.  Neurovascularly intact. No tendon involvement.  No FB by xray or by my exam.  Laceration repaired in ED by me.  Discussed wound care and return precautions with patient and his sister.  Discussed result, findings, treatment, and follow up  with patient.  Pt given return precautions.  Pt verbalizes understanding and agrees with plan.        Trixie Dredge, PA-C 01/28/14 1036

## 2014-07-23 IMAGING — CR DG FOREARM 2V*L*
2 series · 2 of 2 positions shown · non-contrast
Comparison: None.

CLINICAL DATA: EXTREMITY LACERATION ?FB

EXAM:
LEFT FOREARM - 2 VIEW

[x forearm ap left]
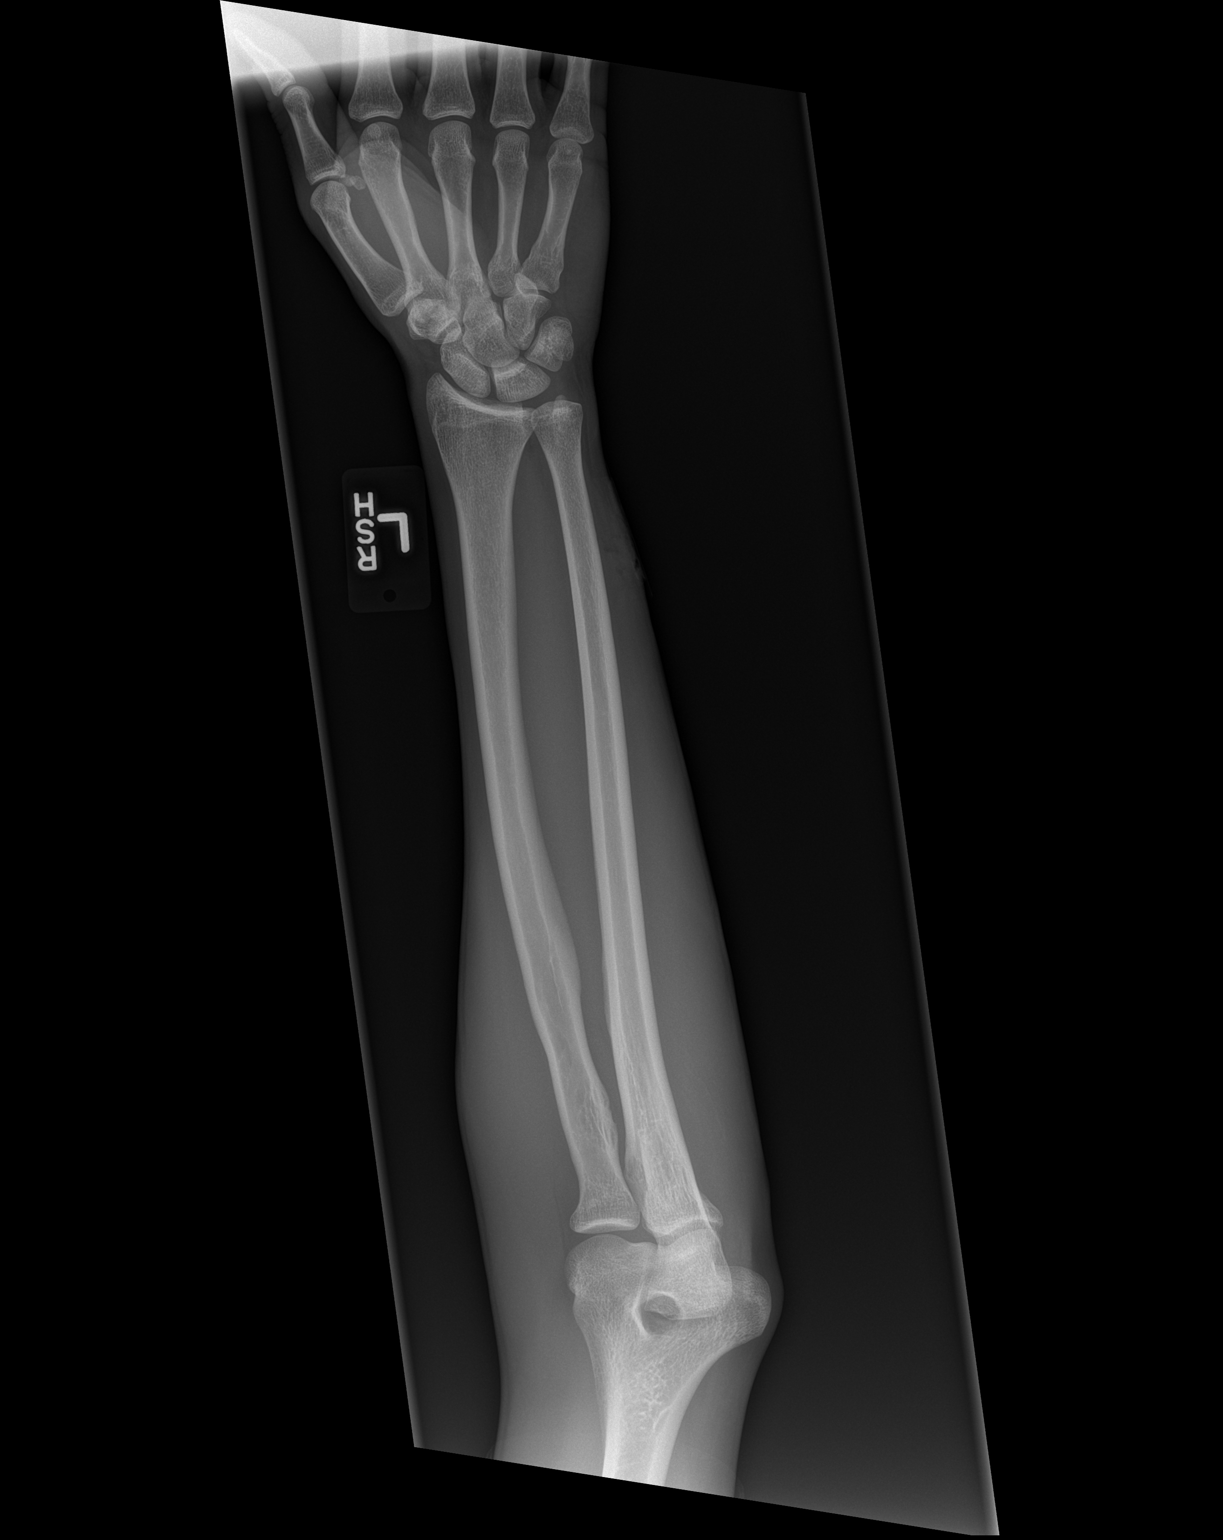

[x forearm lat left]
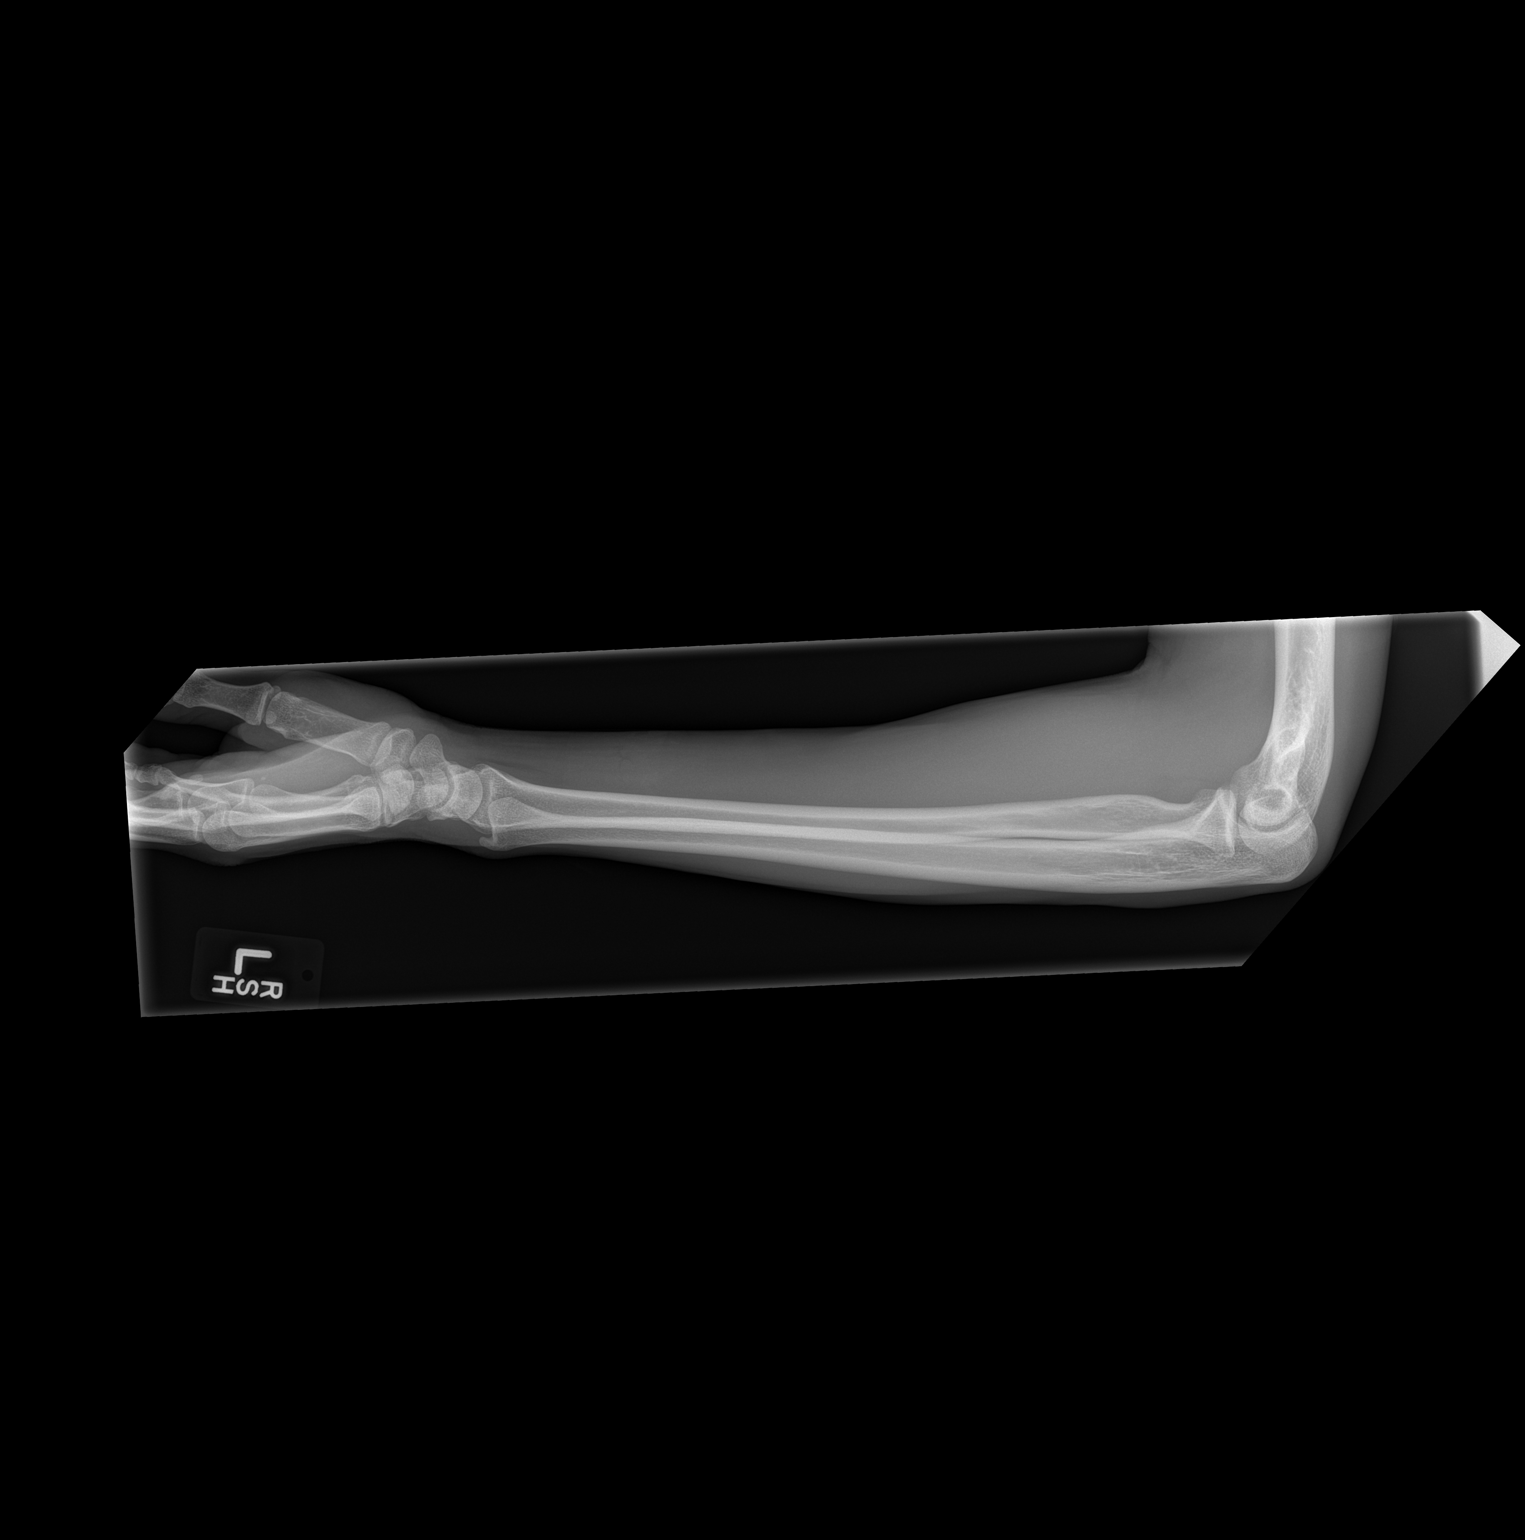

[2 of 2 positions shown; findings below may reference images not displayed]

FINDINGS: There is no evidence of fracture or other focal bone lesions. Soft
tissue laceration distal ulnar aspect of the forearm. No radiopaque
foreign bodies appreciated.
IMPRESSION: Soft tissue laceration otherwise Negative.

## 2015-01-18 ENCOUNTER — Emergency Department (HOSPITAL_COMMUNITY)
Admission: EM | Admit: 2015-01-18 | Discharge: 2015-01-18 | Disposition: A | Discharge status: Home / Self Care | Payer: Medicare Other | Attending: Emergency Medicine | Admitting: Emergency Medicine

## 2015-01-18 ENCOUNTER — Encounter (HOSPITAL_COMMUNITY): Payer: Self-pay | Admitting: Emergency Medicine

## 2015-01-18 DIAGNOSIS — Y998 Other external cause status: Secondary | ICD-10-CM | POA: Diagnosis not present

## 2015-01-18 DIAGNOSIS — T63441A Toxic effect of venom of bees, accidental (unintentional), initial encounter: Secondary | ICD-10-CM | POA: Insufficient documentation

## 2015-01-18 DIAGNOSIS — H9190 Unspecified hearing loss, unspecified ear: Secondary | ICD-10-CM | POA: Insufficient documentation

## 2015-01-18 DIAGNOSIS — X58XXXA Exposure to other specified factors, initial encounter: Secondary | ICD-10-CM | POA: Insufficient documentation

## 2015-01-18 DIAGNOSIS — Y9389 Activity, other specified: Secondary | ICD-10-CM | POA: Diagnosis not present

## 2015-01-18 DIAGNOSIS — T63444A Toxic effect of venom of bees, undetermined, initial encounter: Secondary | ICD-10-CM

## 2015-01-18 DIAGNOSIS — Z720 Tobacco use: Secondary | ICD-10-CM | POA: Diagnosis not present

## 2015-01-18 DIAGNOSIS — Y9289 Other specified places as the place of occurrence of the external cause: Secondary | ICD-10-CM | POA: Diagnosis not present

## 2015-01-18 MED ORDER — HYDROCORTISONE 1 % EX CREA: TOPICAL_CREAM | CUTANEOUS | Status: DC

## 2015-01-18 MED ORDER — DIPHENHYDRAMINE HCL 25 MG PO CAPS
25.0000 mg | ORAL_CAPSULE | Freq: Once | ORAL | Status: AC
  Administered 2015-01-18: 25 mg via ORAL
  Filled 2015-01-18: qty 1

## 2015-01-18 MED ORDER — DIPHENHYDRAMINE HCL 25 MG PO TABS: 25.0000 mg | ORAL_TABLET | Freq: Four times a day (QID) | ORAL | Status: DC

## 2015-01-18 NOTE — Discharge Instructions (Signed)
2. Hand elevated, ice packs several times a day. Benadryl 25 mg every 4-6 hours, apply cortisone cream twice a day. Follow-up as needed.  Bee, Wasp, or Hornet Sting Your caregiver has diagnosed you as having an insect sting. An insect sting appears as a red lump in the skin that sometimes has a tiny hole in the center, or it may have a stinger in the center of the wound. The most common stings are from wasps, hornets and bees. Individuals have different reactions to insect stings.  A normal reaction may cause pain, swelling, and redness around the sting site.  A localized allergic reaction may cause swelling and redness that extends beyond the sting site.  A large local reaction may continue to develop over the next 12 to 36 hours.  On occasion, the reactions can be severe (anaphylactic reaction). An anaphylactic reaction may cause wheezing; difficulty breathing; chest pain; fainting; raised, itchy, red patches on the skin; a sick feeling to your stomach (nausea); vomiting; cramping; or diarrhea. If you have had an anaphylactic reaction to an insect sting in the past, you are more likely to have one again. HOME CARE INSTRUCTIONS   With bee stings, a small sac of poison is left in the wound. Brushing across this with something such as a credit card, or anything similar, will help remove this and decrease the amount of the reaction. This same procedure will not help a wasp sting as they do not leave behind a stinger and poison sac.  Apply a cold compress for 10 to 20 minutes every hour for 1 to 2 days, depending on severity, to reduce swelling and itching.  To lessen pain, a paste made of water and baking soda may be rubbed on the bite or sting and left on for 5 minutes.  To relieve itching and swelling, you may use take medication or apply medicated creams or lotions as directed.  Only take over-the-counter or prescription medicines for pain, discomfort, or fever as directed by your  caregiver.  Wash the sting site daily with soap and water. Apply antibiotic ointment on the sting site as directed.  If you suffered a severe reaction:  If you did not require hospitalization, an adult will need to stay with you for 24 hours in case the symptoms return.  You may need to wear a medical bracelet or necklace stating the allergy.  You and your family need to learn when and how to use an anaphylaxis kit or epinephrine injection.  If you have had a severe reaction before, always carry your anaphylaxis kit with you. SEEK MEDICAL CARE IF:   None of the above helps within 2 to 3 days.  The area becomes red, warm, tender, and swollen beyond the area of the bite or sting.  You have an oral temperature above 102 F (38.9 C). SEEK IMMEDIATE MEDICAL CARE IF:  You have symptoms of an allergic reaction which are:  Wheezing.  Difficulty breathing.  Chest pain.  Lightheadedness or fainting.  Itchy, raised, red patches on the skin.  Nausea, vomiting, cramping or diarrhea. ANY OF THESE SYMPTOMS MAY REPRESENT A SERIOUS PROBLEM THAT IS AN EMERGENCY. Do not wait to see if the symptoms will go away. Get medical help right away. Call your local emergency services (911 in [place] DO NOT drive yourself to the hospital. MAKE SURE YOU:   Understand these instructions.  Will watch your condition.  Will get help right away if you are not doing well or get  worse. Document Released: 08/12/2005 Document Revised: 11/04/2011 Document Reviewed: 01/27/2010 [facility] Patient Information 2015 [place], [place]. This information is not intended to replace advice given to you by your health care provider. Make sure you discuss any questions you have with your health care provider.

## 2015-01-18 NOTE — ED Provider Notes (Signed)
CSN: [id number]     Arrival date & time 01/18/15  0845 History   First MD Initiated Contact with Patient 01/18/15 [phone]     Chief Complaint  Patient presents with  . Insect Bite     (Consider location/radiation/quality/duration/timing/severity/associated sxs/prior Treatment) HPI Carl Orr is a 29 y.o. male with no medical problems, other than being deaf, presents to ED with complaint of left hand swelling after a bee sting yesterday. Patient states to be stung him several times on the dorsal left hand. States had pain and some swelling yesterday, but swelling got worse this morning. He states it is painful and very itchy. He has not taken any medications. He has no history of allergies to the bee stings. He has no other complaints including no shortness of breath, no swelling of the lips, tongue, throat, no hives.   Past Medical History  Diagnosis Date  . Deaf    Past Surgical History  Procedure Laterality Date  . Abdominal surgery      at birth  . Testicle torsion reduction    . Knee surgery     No family history on file. History  Substance Use Topics  . Smoking status: Current Every Day Smoker -- 0.50 packs/day    Types: Cigarettes  . Smokeless tobacco: Not on file  . Alcohol Use: No    Review of Systems  Constitutional: Negative for fever and chills.  Musculoskeletal: Positive for arthralgias.  Skin: Positive for color change and rash.  All other systems reviewed and are negative.     Allergies  Hydrocodone  Home Medications   Prior to Admission medications   Medication Sig Start Date End Date Taking? Authorizing Provider  acetaminophen (TYLENOL) 500 MG tablet Take 500 mg by mouth every 6 (six) hours as needed for moderate pain or headache.   Yes Historical Provider, MD  diphenhydrAMINE (BENADRYL) 25 MG tablet Take 1 tablet (25 mg total) by mouth every 6 (six) hours. 01/18/15   Dewanna Hurston, PA-C  hydrocortisone cream 1 % Apply to affected area 2 times  daily 01/18/15   Anjanette Gilkey, PA-C   BP 130/70 mmHg  Pulse 52  Temp(Src) 98.2 F (36.8 C) (Oral)  Resp 16  SpO2 100% Physical Exam  Constitutional: He appears well-developed and well-nourished. No distress.  Eyes: Conjunctivae are normal.  Cardiovascular: Normal rate, regular rhythm and normal heart sounds.   Pulmonary/Chest: Effort normal and breath sounds normal. No respiratory distress. He has no wheezes.  Musculoskeletal:       Hands: Swelling noted to the left hand mainly over dorsal surface. No stingers noted. Full range of motion of the hand, wrist, all fingers. Dorsum of the hand is erythematous, it is not warm to the touch. No induration of the skin palpated.  Nursing note and vitals reviewed.   ED Course  Procedures (including critical care time) Labs Review Labs Reviewed - No data to display  Imaging Review No results found.   EKG Interpretation None      MDM   Final diagnoses:  Bee sting reaction, undetermined intent, initial encounter    Sign language interpreter used to communicate with this patient. Patient with mild swelling to the left dorsal hand after a bee sting yesterday. Area is erythematous, mildly swollen. No stinger noted. It is not warm to the touch. Patient does not have a fever. Full range of motion of all fingers and wrist. Most likely localized reaction to sting. Will treat with Benadryl and hydrocortisone cream. Return  precautions discussed. Patient had voiced understanding. All questions answered.  Filed Vitals:   01/18/15 0902  BP: 130/70  Pulse: 52  Temp: 98.2 F (36.8 C)  TempSrc: Oral  Resp: 16  SpO2: 100%       Jaynie Crumbleatyana Marializ Ferrebee, PA-C 01/18/15 1004  Azalia BilisKevin Campos, MD 01/18/15 [phone]

## 2015-01-18 NOTE — ED Notes (Signed)
Misty StanleyLisa informed we need a sign language interpreter.

## 2015-01-18 NOTE — ED Notes (Signed)
Pt c/o bee sting to L hand yesterday. Pt sts that since then his hand has become swollen and c/o pain "down to his bone." Pt A&Ox4. Denies SOB, chest pain.

## 2016-02-26 ENCOUNTER — Emergency Department (HOSPITAL_COMMUNITY): Payer: Medicare Other

## 2016-02-26 ENCOUNTER — Encounter (HOSPITAL_COMMUNITY): Payer: Self-pay | Admitting: Emergency Medicine

## 2016-02-26 ENCOUNTER — Emergency Department (HOSPITAL_COMMUNITY)
Admission: EM | Admit: 2016-02-26 | Discharge: 2016-02-27 | Disposition: A | Discharge status: Other Acute Inpatient Hospital | Payer: Medicare Other | Attending: Emergency Medicine | Admitting: Emergency Medicine

## 2016-02-26 DIAGNOSIS — F17210 Nicotine dependence, cigarettes, uncomplicated: Secondary | ICD-10-CM | POA: Insufficient documentation

## 2016-02-26 DIAGNOSIS — R45851 Suicidal ideations: Secondary | ICD-10-CM | POA: Diagnosis not present

## 2016-02-26 DIAGNOSIS — F1010 Alcohol abuse, uncomplicated: Secondary | ICD-10-CM | POA: Insufficient documentation

## 2016-02-26 DIAGNOSIS — R079 Chest pain, unspecified: Secondary | ICD-10-CM | POA: Insufficient documentation

## 2016-02-26 DIAGNOSIS — F1410 Cocaine abuse, uncomplicated: Secondary | ICD-10-CM | POA: Diagnosis not present

## 2016-02-26 LAB — BASIC METABOLIC PANEL
ANION GAP: 11 (ref 5–15)
BUN: 18 mg/dL (ref 6–20)
CHLORIDE: 106 mmol/L (ref 101–111)
CO2: 21 mmol/L — AB (ref 22–32)
Calcium: 9.8 mg/dL (ref 8.9–10.3)
Creatinine, Ser: 1.5 mg/dL — ABNORMAL HIGH (ref 0.61–1.24)
GFR calc non Af Amer: >60 mL/min (ref >60)
Glucose, Bld: 90 mg/dL (ref 65–99)
POTASSIUM: 4.2 mmol/L (ref 3.5–5.1)
Sodium: 138 mmol/L (ref 135–145)

## 2016-02-26 LAB — CBC WITH DIFFERENTIAL/PLATELET
BASOS ABS: 0 10*3/uL (ref 0.0–0.1)
BASOS PCT: 0 %
EOS ABS: 0 10*3/uL (ref 0.0–0.7)
Eosinophils Relative: 0 %
HEMATOCRIT: 43.3 % (ref 39.0–52.0)
HEMOGLOBIN: 14.5 g/dL (ref 13.0–17.0)
Lymphocytes Relative: 9 %
Lymphs Abs: 1.3 10*3/uL (ref 0.7–4.0)
MCH: 30.1 pg (ref 26.0–34.0)
MCHC: 33.5 g/dL (ref 30.0–36.0)
MCV: 89.8 fL (ref 78.0–100.0)
MONO ABS: 0.7 10*3/uL (ref 0.1–1.0)
Monocytes Relative: 5 %
NEUTROS ABS: 12.3 10*3/uL — AB (ref 1.7–7.7)
NEUTROS PCT: 86 %
Platelets: 162 10*3/uL (ref 150–400)
RBC: 4.82 MIL/uL (ref 4.22–5.81)
RDW: 13.7 % (ref 11.5–15.5)
WBC: 14.3 10*3/uL — ABNORMAL HIGH (ref 4.0–10.5)

## 2016-02-26 LAB — I-STAT TROPONIN, ED
Troponin i, poc: 0.04 ng/mL (ref 0.00–0.08)
Troponin i, poc: 0.03 ng/mL (ref 0.00–0.08)

## 2016-02-26 LAB — CBG MONITORING, ED: GLUCOSE-CAPILLARY: 89 mg/dL (ref 65–99)

## 2016-02-26 LAB — ACETAMINOPHEN LEVEL

## 2016-02-26 LAB — SALICYLATE LEVEL

## 2016-02-26 LAB — ETHANOL: Alcohol, Ethyl (B): <5 mg/dL (ref <5)

## 2016-02-26 MED ORDER — SODIUM CHLORIDE 0.9 % IV BOLUS (SEPSIS)
1000.0000 mL | Freq: Once | INTRAVENOUS | Status: AC
  Administered 2016-02-26: 1000 mL via INTRAVENOUS

## 2016-02-26 NOTE — ED Notes (Addendum)
Pt requesting assistance for drug addiction; RN asked patient "what drugs other than alcohol that you reported earlier?" pt stated "anything and everything"; MD Riester notified

## 2016-02-26 NOTE — ED Provider Notes (Signed)
Pt presented to the ED with complaints of depression as well as suicidal ideation. He was outside mowing the lawn today however his chest has been sore the entire day. Denies any cough or shortness of breath.  She also complains of depression and suicidal ideation. He has had various thoughts of hurting himself. He admits to drinking alcohol.  Denies any ingestions. Physical Exam  BP 113/69 mmHg  Pulse 67  Temp(Src) 98.9 F (37.2 C) (Oral)  Resp 17  SpO2 99%  Physical Exam  Constitutional: No distress.  Thin, underweight  HENT:  Head: Normocephalic and atraumatic.  Right Ear: External ear normal.  Left Ear: External ear normal.  Eyes: Conjunctivae are normal. Right eye exhibits no discharge. Left eye exhibits no discharge. No scleral icterus.  Neck: Neck supple. No tracheal deviation present.  Cardiovascular: Normal rate.   Pulmonary/Chest: Effort normal. No stridor. No respiratory distress.  Musculoskeletal: He exhibits no edema.  Neurological: He is alert. Cranial nerve deficit: no gross deficits.  Skin: Skin is warm. No rash noted. He is not diaphoretic.  Psychiatric: His affect is not labile and not inappropriate. His speech is not rapid and/or pressured. He is withdrawn. He exhibits a depressed mood.  Nursing note and vitals reviewed.   ED Course  Procedures  EKG Interpretation  Date/Time:  Monday February 26 2016 19:14:43 EDT Ventricular Rate:  71 PR Interval:    QRS Duration: 91 QT Interval:  384 QTC Calculation: 418 R Axis:   54 Text Interpretation:  Sinus rhythm RSR' in V1 or V2, probably normal variant ST elev, probable normal early repol pattern No significant change since last tracing Confirmed by Rechelle Niebla  MD-J, Persis Graffius ([id number]) on 02/26/2016 7:46:30 PM       MDM Patient overall is low risk for heart disease. Initial cardiac evaluation is reassuring. He has an elevated white blood cell count there is no evidence of pneumonia. There does not appear to be any sign of an  acute infection.  Plan on  psychiatric evaluation regarding his depression.      Linwood DibblesJon Ling Flesch, MD 02/26/16 2028

## 2016-02-26 NOTE — BH Assessment (Addendum)
Tele Assessment Note   Carl BlinksKevin Langhorst is an 30 y.o. male presenting to [facility] reporting suicidal ideation with a plan to overdose. Pt reported that he attempted suicide by cutting 7 or 8 years ago. PT did not report any self-injurious behaviors or a family history of suicide. PT did not report any current mental health treatment but shared that he was hospitalized after his suicide attempt. Pt denies HI and AVH at this time. Pt reported that his alcohol use is a stressor and is endorsing multiple depressive symptoms. Pt reported that he has experienced some weight loss and believes it was approximately 20lbs. Pt reported daily alcohol and marijuana use and shared that he uses cocaine "every now and then". Pt did not report any physical, sexual or emotional abuse at this time.  Inpatient treatment is recommended.    Diagnosis: Major Depressive Disorder, Recurrent episode, without psychosis; Cannabis Use Disorder, Alcohol Use Disorder     Past Medical History:  Past Medical History  Diagnosis Date  . Deaf     Past Surgical History  Procedure Laterality Date  . Abdominal surgery      at birth  . Testicle torsion reduction    . Knee surgery      Family History: History reviewed. No pertinent family history.  Social History:  reports that he has been smoking Cigarettes.  He has been smoking about 0.50 packs per day. He does not have any smokeless tobacco history on file. He reports that he does not drink alcohol or use illicit drugs.  Additional Social History:  Alcohol / Drug Use History of alcohol / drug use?: Yes Substance #1 Name of Substance 1: Cocaine  1 - Age of First Use: 21 1 - Amount (size/oz): $10  1 - Frequency: "once in a while"  1 - Duration: ongoing  1 - Last Use / Amount: 02-26-16 Substance #2 Name of Substance 2: THC  2 - Age of First Use: 16 2 - Amount (size/oz): "a lot"  2 - Frequency: daily   2 - Duration: ongoing  2 - Last Use / Amount: 02-26-16 Substance #3 Name of  Substance 3: Alcohol  3 - Age of First Use: 22 3 - Amount (size/oz): 2 40oz 3 - Frequency: daily  CIWA: CIWA-Ar BP: 108/69 mmHg Pulse Rate: (!) 56 COWS:    PATIENT STRENGTHS: (choose at least two) Average or above average intelligence Motivation for treatment/growth  Allergies:  Allergies  Allergen Reactions  . Hydrocodone Anaphylaxis    Home Medications:  (Not in a hospital admission)  OB/GYN Status:  No LMP for male patient.  General Assessment Data [facility] Assessment: In system Is this a Tele or Face-to-Face Assessment?: Tele Assessment Is this an Initial Assessment or a Re-assessment for this encounter?: Initial Assessment Marital status: Single Living Arrangements: Other relatives (Sister ) Can pt return to current living arrangement?: Yes Admission Status: Voluntary Is patient capable of signing voluntary admission?: Yes Referral Source: Self/Family/Friend Insurance type: Medicare      Crisis Care Plan Living Arrangements: Other relatives (Sister ) Name of Psychiatrist: Pt denies  Name of Therapist: Pt denies   Education Status Is patient currently in school?: No  Risk to self with the past 6 months Suicidal Ideation: Yes-Currently Present Has patient been a risk to self within the past 6 months prior to admission? : Yes Suicidal Intent: Yes-Currently Present Has patient had any suicidal intent within the past 6 months prior to admission? : Yes  Is patient at risk for suicide?: Yes Suicidal Plan?: Yes-Currently Present Has patient had any suicidal plan within the past 6 months prior to admission? : No Specify Current Suicidal Plan: "overdose"  Access to Means: Yes Specify Access to Suicidal Means: access to drugs and alcohol  What has been your use of drugs/alcohol within the last 12 months?: Alcohol, marijuana and cocaine use reported.  Previous Attempts/Gestures: Yes How many times?: 1 Other Self Harm Risks: substance  abuse  Triggers for Past Attempts: Unpredictable Intentional Self Injurious Behavior: None Family Suicide History: No Recent stressful life event(s): Other (Comment) (Substance abuse ) Persecutory voices/beliefs?: No Depression: Yes Depression Symptoms: Despondent, Isolating, Tearfulness, Insomnia, Feeling worthless/self pity, Loss of interest in usual pleasures, Guilt, Fatigue Substance abuse history and/or treatment for substance abuse?: Yes Suicide prevention information given to non-admitted patients: Not applicable  Risk to Others within the past 6 months Homicidal Ideation: No Does patient have any lifetime risk of violence toward others beyond the six months prior to admission? : No Thoughts of Harm to Others: No Current Homicidal Intent: No Current Homicidal Plan: No Access to Homicidal Means: No Identified Victim: N/A History of harm to others?: No Assessment of Violence: None Noted Violent Behavior Description: No violent behaviors observed. PT is calm and cooperative at this time.  Does patient have access to weapons?: No Criminal Charges Pending?: No Does patient have a court date: No Is patient on probation?: No  Psychosis Hallucinations: None noted Delusions: None noted  Mental Status Report Appearance/Hygiene: In hospital gown Eye Contact: Fair Motor Activity: Freedom of movement Speech: Unable to assess (Pt is deaf) Level of Consciousness: Quiet/awake Mood: Euthymic, Pleasant Affect: Appropriate to circumstance Anxiety Level: Minimal Thought Processes: Coherent, Relevant Judgement: Partial Orientation: Appropriate for developmental age Obsessive Compulsive Thoughts/Behaviors: None  Cognitive Functioning Concentration: Decreased Memory: Recent Intact, Remote Intact IQ: Average Insight: Fair Impulse Control: Fair Appetite: Fair Weight Loss:  ("20lbs maybe") Weight Gain: 0 Sleep: Decreased Total Hours of Sleep: 5 Vegetative Symptoms: Staying in  bed  ADLScreening [facility]) Patient's cognitive ability adequate to safely complete daily activities?: Yes Patient able to express need for assistance with ADLs?: Yes Independently performs ADLs?: Yes (appropriate for developmental age)  Prior Inpatient Therapy Prior Inpatient Therapy: Yes Prior Therapy Dates:  ("7or 8 years ago") Prior Therapy Facilty/Provider(s):  ("[facility]") Reason for Treatment: Suicide attempt   Prior Outpatient Therapy Prior Outpatient Therapy: Yes Prior Therapy Dates:  ("7 or 8 years ago". ) Prior Therapy Facilty/Provider(s):  (pt unable to recall.) Reason for Treatment: depression Does patient have an ACCT team?: No Does patient have Intensive In-House Services?  : No Does patient have [facility] services? : No Does patient have P4CC services?: No  ADL Screening (condition at time of admission) Patient's cognitive ability adequate to safely complete daily activities?: Yes Is the patient deaf or have difficulty hearing?: Yes (Deaf) Does the patient have difficulty seeing, even when wearing glasses/contacts?: No Does the patient have difficulty concentrating, remembering, or making decisions?: No Patient able to express need for assistance with ADLs?: Yes Does the patient have difficulty dressing or bathing?: No Independently performs ADLs?: Yes (appropriate for developmental age)       Abuse/Neglect Assessment (Assessment to be complete while patient is alone) Physical Abuse: Denies Verbal Abuse: Denies Sexual Abuse: Denies Exploitation of patient/patient's resources: Denies Self-Neglect: Denies     Merchant navy officerAdvance Directives (For Healthcare) Does patient have an advance directive?: No    Additional Information 1:1 In Past  12 Months?: No CIRT Risk: No Elopement Risk: No Does patient have medical clearance?: Yes     Disposition:  Disposition Initial Assessment Completed for this Encounter: Yes Disposition of Patient: Inpatient  treatment program Type of inpatient treatment program: Adult  Magnolia Mattila S 02/26/2016 10:51 PM

## 2016-02-26 NOTE — ED Notes (Signed)
[facility]

## 2016-02-26 NOTE — ED Notes (Signed)
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

## 2016-02-26 NOTE — ED Provider Notes (Signed)
CSN: [id number]     Arrival date & time 02/26/16  1903 History   First MD Initiated Contact with Patient 02/26/16 1904     Chief Complaint  Patient presents with  . Chest Pain  . Suicidal   (Consider location/radiation/quality/duration/timing/severity/associated sxs/prior Treatment) Patient is a 30 y.o. male presenting with chest pain. The history is provided by the patient.  Chest Pain Pain location:  R lateral chest and L lateral chest Pain quality: aching and sharp   Pain radiates to:  Does not radiate Pain radiates to the back: no   Pain severity:  Mild Onset quality:  Gradual Duration:  6 months Timing:  Constant Progression:  Unchanged Chronicity:  Chronic Context: not breathing   Relieved by:  Nothing Associated symptoms: no abdominal pain, no back pain, no cough, no dizziness, no fever, no nausea, no palpitations, no shortness of breath and not vomiting     Past Medical History  Diagnosis Date  . Deaf    Past Surgical History  Procedure Laterality Date  . Abdominal surgery      at birth  . Testicle torsion reduction    . Knee surgery     History reviewed. No pertinent family history. Social History  Substance Use Topics  . Smoking status: Current Every Day Smoker -- 0.50 packs/day    Types: Cigarettes  . Smokeless tobacco: None  . Alcohol Use: No    Review of Systems  Constitutional: Negative for fever and chills.  HENT: Negative for congestion and sore throat.   Eyes: Negative for pain.  Respiratory: Negative for cough and shortness of breath.   Cardiovascular: Positive for chest pain. Negative for palpitations.  Gastrointestinal: Negative for nausea, vomiting, abdominal pain and diarrhea.  Endocrine: Negative.   Genitourinary: Negative for flank pain.  Musculoskeletal: Negative for back pain and neck pain.  Skin: Negative for rash.  Allergic/Immunologic: Negative.   Neurological: Negative for dizziness, syncope and light-headedness.    Psychiatric/Behavioral: Positive for suicidal ideas and dysphoric mood. Negative for hallucinations and confusion.    Allergies  Hydrocodone  Home Medications   Prior to Admission medications   Medication Sig Start Date End Date Taking? Authorizing Provider  diphenhydrAMINE (BENADRYL) 25 MG tablet Take 1 tablet (25 mg total) by mouth every 6 (six) hours. Patient not taking: Reported on 02/26/2016 01/18/15   Jaynie Crumbleatyana Kirichenko, PA-C  hydrocortisone cream 1 % Apply to affected area 2 times daily Patient not taking: Reported on 02/26/2016 01/18/15   Tatyana Kirichenko, PA-C   BP 105/75 mmHg  Pulse 52  Temp(Src) 98.9 F (37.2 C) (Oral)  Resp 18  SpO2 100% Physical Exam  Constitutional: He is oriented to person, place, and time. He appears well-developed and well-nourished.  HENT:  Head: Normocephalic and atraumatic.  Eyes: Conjunctivae and EOM are normal. Pupils are equal, round, and reactive to light.  Neck: Normal range of motion. Neck supple.  Cardiovascular: Normal rate, regular rhythm, normal heart sounds and intact distal pulses.   Pulmonary/Chest: Effort normal and breath sounds normal. No respiratory distress.    Abdominal: Soft. Bowel sounds are normal. There is no tenderness.  Musculoskeletal: Normal range of motion.  Neurological: He is alert and oriented to person, place, and time. He has normal reflexes. No cranial nerve deficit.  Skin: Skin is warm and dry.  Psychiatric: His affect is blunt. His speech is delayed. He is slowed and withdrawn. He is not actively hallucinating. He exhibits a depressed mood. He expresses suicidal ideation. He expresses no  homicidal ideation. He expresses suicidal plans.    ED Course  Procedures (including critical care time) Labs Review Labs Reviewed  CBC WITH DIFFERENTIAL/PLATELET - Abnormal; Notable for the following:    WBC 14.3 (*)    Neutro Abs 12.3 (*)    All other components within normal limits  BASIC METABOLIC PANEL -  Abnormal; Notable for the following:    CO2 21 (*)    Creatinine, Ser 1.50 (*)    All other components within normal limits  ACETAMINOPHEN LEVEL - Abnormal; Notable for the following:    Acetaminophen (Tylenol), Serum <10 (*)    All other components within normal limits  SALICYLATE LEVEL  ETHANOL  URINE RAPID DRUG SCREEN, HOSP PERFORMED  HIV ANTIBODY (ROUTINE TESTING)  I-STAT TROPOININ, ED  CBG MONITORING, ED  Rosezena SensorI-STAT TROPOININ, ED    Imaging Review Dg Chest 2 View  02/26/2016  CLINICAL DATA:  Syncope and chest pain EXAM: CHEST  2 VIEW COMPARISON:  None. FINDINGS: Lungs are clear. Heart size and pulmonary vascularity are normal. No adenopathy. No bone lesions. No pneumothorax. IMPRESSION: No edema or consolidation. Electronically Signed   By: Bretta BangWilliam  Woodruff III M.D.   On: 02/26/2016 19:53   I have personally reviewed and evaluated these images and lab results as part of my medical decision-making.   EKG Interpretation   Date/Time:  Monday February 26 2016 19:14:43 EDT Ventricular Rate:  71 PR Interval:    QRS Duration: 91 QT Interval:  384 QTC Calculation: 418 R Axis:   54 Text Interpretation:  Sinus rhythm RSR' in V1 or V2, probably normal  variant ST elev, probable normal early repol pattern No significant change  since last tracing Confirmed by KNAPP  MD-J, JON ([id number]) on 02/26/2016  7:46:30 PM      MDM   Final diagnoses:  None   The pt is a 30 yo male with self reported polysubstance abuse and chronic chest pain presenting to the ED for reports of CP exacerbation and SI.  Pt endorses chronic chest pain that was exacerbated by cocaine this morning as well as alcohol use.  Also endorses increasing depressed mood as well as possible plans of suicide via drug overdose.    On exam pt is withdrawn.  Limited by pt being deaf and sign language interpreter used.  Admits to chronic chest pain and TTP over chest wall.  ECG without acute ischemic changes or arrhythmia.  PERC  negative.  HEART low risk and trop and delta trop negative.  Pt denies attempted OD or ingestion.  UDS pending.  Negative salicylate and tylenol level.  Medically cleared.   Psych consulted who agree to admission.  Will await placement in the ED.    Labs were viewed by myself and incorporated into medical decision making.  Pt care supervised by my attending Dr. Lynelle DoctorKnapp.   Tery SanfilippoMatthew Felisha Claytor, MD PGY-3 Emergency Medicine     Tery SanfilippoMatthew Jahmil Macleod, MD 02/27/16 [phone]

## 2016-02-26 NOTE — BH Assessment (Signed)
Assessment completed. Consulted Maryjean Mornharles Kober, PA-C who recommended inpatient treatment. TTS to seek placement. Informed Dr. Lendon Collariester of the recommendation.

## 2016-02-26 NOTE — ED Notes (Signed)
Pt given snack pack and ginger ale at this time

## 2016-02-27 ENCOUNTER — Encounter (HOSPITAL_COMMUNITY): Payer: Self-pay

## 2016-02-27 ENCOUNTER — Inpatient Hospital Stay (HOSPITAL_COMMUNITY)
Admission: AD | Admit: 2016-02-27 | Discharge: 2016-03-01 | DRG: 897 | Disposition: A | Discharge status: Home / Self Care | Payer: Medicare Other | Source: Intra-hospital | Attending: Psychiatry | Admitting: Psychiatry

## 2016-02-27 DIAGNOSIS — F1024 Alcohol dependence with alcohol-induced mood disorder: Secondary | ICD-10-CM | POA: Diagnosis present

## 2016-02-27 DIAGNOSIS — F1020 Alcohol dependence, uncomplicated: Secondary | ICD-10-CM | POA: Diagnosis not present

## 2016-02-27 DIAGNOSIS — H9190 Unspecified hearing loss, unspecified ear: Secondary | ICD-10-CM | POA: Diagnosis present

## 2016-02-27 DIAGNOSIS — F17210 Nicotine dependence, cigarettes, uncomplicated: Secondary | ICD-10-CM | POA: Diagnosis present

## 2016-02-27 DIAGNOSIS — R079 Chest pain, unspecified: Secondary | ICD-10-CM | POA: Diagnosis not present

## 2016-02-27 DIAGNOSIS — R45851 Suicidal ideations: Secondary | ICD-10-CM | POA: Diagnosis present

## 2016-02-27 LAB — I-STAT TROPONIN, ED: TROPONIN I, POC: 0.02 ng/mL (ref 0.00–0.08)

## 2016-02-27 MED ORDER — LOPERAMIDE HCL 2 MG PO CAPS: 2.0000 mg | ORAL_CAPSULE | ORAL | Status: DC | PRN

## 2016-02-27 MED ORDER — VITAMIN B-1 100 MG PO TABS
100.0000 mg | ORAL_TABLET | Freq: Every day | ORAL | Status: DC
  Administered 2016-02-28 – 2016-03-01 (×3): 100 mg via ORAL
  Filled 2016-02-27 (×4): qty 1

## 2016-02-27 MED ORDER — LORAZEPAM 1 MG PO TABS: 1.0000 mg | ORAL_TABLET | Freq: Four times a day (QID) | ORAL | Status: DC | PRN

## 2016-02-27 MED ORDER — ACETAMINOPHEN 325 MG PO TABS: 650.0000 mg | ORAL_TABLET | Freq: Four times a day (QID) | ORAL | Status: DC | PRN

## 2016-02-27 MED ORDER — LORAZEPAM 1 MG PO TABS
1.0000 mg | ORAL_TABLET | Freq: Every day | ORAL | Status: AC
  Administered 2016-03-01: 1 mg via ORAL
  Filled 2016-02-27: qty 1

## 2016-02-27 MED ORDER — ONDANSETRON 4 MG PO TBDP: 4.0000 mg | ORAL_TABLET | Freq: Four times a day (QID) | ORAL | Status: DC | PRN

## 2016-02-27 MED ORDER — ENSURE ENLIVE PO LIQD
237.0000 mL | Freq: Two times a day (BID) | ORAL | Status: DC
  Administered 2016-02-29: 237 mL via ORAL

## 2016-02-27 MED ORDER — TRAZODONE HCL 50 MG PO TABS
50.0000 mg | ORAL_TABLET | Freq: Every evening | ORAL | Status: DC | PRN
  Administered 2016-02-27: 50 mg via ORAL
  Filled 2016-02-27: qty 1

## 2016-02-27 MED ORDER — LORAZEPAM 1 MG PO TABS
1.0000 mg | ORAL_TABLET | Freq: Three times a day (TID) | ORAL | Status: AC
  Administered 2016-02-28 (×2): 1 mg via ORAL
  Filled 2016-02-27 (×3): qty 1

## 2016-02-27 MED ORDER — ALUM & MAG HYDROXIDE-SIMETH 200-200-20 MG/5ML PO SUSP: 30.0000 mL | ORAL | Status: DC | PRN

## 2016-02-27 MED ORDER — MAGNESIUM HYDROXIDE 400 MG/5ML PO SUSP: 30.0000 mL | Freq: Every day | ORAL | Status: DC | PRN

## 2016-02-27 MED ORDER — LORAZEPAM 1 MG PO TABS
1.0000 mg | ORAL_TABLET | Freq: Two times a day (BID) | ORAL | Status: AC
  Administered 2016-02-29 (×2): 1 mg via ORAL
  Filled 2016-02-27: qty 1

## 2016-02-27 MED ORDER — LORAZEPAM 1 MG PO TABS
1.0000 mg | ORAL_TABLET | Freq: Four times a day (QID) | ORAL | Status: AC
  Administered 2016-02-27 (×3): 1 mg via ORAL
  Filled 2016-02-27 (×3): qty 1

## 2016-02-27 MED ORDER — THIAMINE HCL 100 MG/ML IJ SOLN
100.0000 mg | Freq: Once | INTRAMUSCULAR | Status: AC
  Administered 2016-02-27: 100 mg via INTRAMUSCULAR
  Filled 2016-02-27: qty 2

## 2016-02-27 MED ORDER — ADULT MULTIVITAMIN W/MINERALS CH
1.0000 | ORAL_TABLET | Freq: Every day | ORAL | Status: DC
  Administered 2016-02-27 – 2016-03-01 (×4): 1 via ORAL
  Filled 2016-02-27 (×7): qty 1

## 2016-02-27 MED ORDER — HYDROXYZINE HCL 25 MG PO TABS
25.0000 mg | ORAL_TABLET | Freq: Four times a day (QID) | ORAL | Status: DC | PRN
  Administered 2016-02-28: 25 mg via ORAL
  Filled 2016-02-27: qty 1

## 2016-02-27 NOTE — ED Notes (Signed)
Maddie w/[facility] [employer] called asking if pt needs an interpretor while waiting for placement. Advised her will call when pt has been accepted to facility or as needed. [phone].

## 2016-02-27 NOTE — Progress Notes (Signed)
Carl Orr is a 30 y.o. male being [facility]  He came to the ED reporting suicidal ideation with a plan to overdose. Pt reported that he attempted suicide by cutting 7 or 8 years ago.  He did not report any self-injurious behaviors or a family history of suicide. PT did not report any current mental health treatment but shared that he was hospitalized after his suicide attempt. Pt denies HI and AVH at this time. Pt reported that his alcohol use is a stressor and is endorsing multiple depressive symptoms. Pt reported that he has experienced some weight loss and believes it was approximately 20lbs. Pt reported daily alcohol and marijuana use and shared that he uses cocaine "every now and then."  He is diagnosed with Major Depressive Disorder, Recurrent episode, without psychosis; Cannabis Use Disorder, Alcohol Use Disorder.  He currently denies SI/HI or A/V hallucinations.  He agrees to contract for safety on the unit.    Admission paperwork completed and signed with the assistance of an interpreter from "[employer] [facility] [employer]."  Belongings searched and secured in locker # 33.  Skin assessment completed and no skin issues noted.  Q 15 minute checks initiated for safety.  We will monitor the progress towards his goals.

## 2016-02-27 NOTE — ED Notes (Signed)
Pt ambulatory to restroom without assistance; pt refusing to provide urine sample

## 2016-02-27 NOTE — ED Notes (Signed)
The patient was given a snack and a regular diet was ordered for lunch.

## 2016-02-27 NOTE — ED Notes (Signed)
Maddie - [facility] aware of need for interpretor d/t [facility] [phone]. Advised she will notify interpretor on call.

## 2016-02-27 NOTE — ED Notes (Signed)
Patient changed into purple scrubs at this time and security notified to wand patient. Patient is still refusing to provide a urine sample at this time

## 2016-02-27 NOTE — ED Notes (Signed)
Patient refused to give a urine, the Nurse is aware.

## 2016-02-27 NOTE — Progress Notes (Signed)
Patient ID: Carl BlinksKevin Concannon, male   DOB: 1985/11/16, 30 y.o.   MRN: [id number] PER STATE REGULATIONS 482.30  THIS CHART WAS REVIEWED FOR MEDICAL NECESSITY WITH RESPECT TO THE PATIENT'S ADMISSION/DURATION OF STAY.  NEXT REVIEW DATE:03/02/16  Loura HaltBARBARA Nannette Zill, RN, BSN CASE MANAGER

## 2016-02-27 NOTE — ED Notes (Addendum)
Pt ambulatory to C21 - pt wearing maroon-colored paper scrubs. Pt alert - aware waiting for placement. Per report, pt refusing to give urine specimen and [facility].

## 2016-02-27 NOTE — ED Notes (Addendum)
Pt requested for RN to call sister Willadean Carol- Jeannie Herbin - [phone] to [facility]. Sister requesting to speak w/pt.

## 2016-02-27 NOTE — ED Notes (Signed)
Pt signed consent forms - copy sent to medical records, [facility] and original [facility].

## 2016-02-27 NOTE — ED Notes (Signed)
[facility]pret[facility] 1300 per Maddie.

## 2016-02-27 NOTE — ED Notes (Signed)
Pt spoke w/sister on phone.

## 2016-02-27 NOTE — ED Notes (Signed)
Per Kenney HousemanLindsay, AC, [facility] pt accepted [facility] - Dr Jama Flavorsobos - may send now. Advised her of deaf interpretor's number - Maddie - [phone] - and that RN will call to notify.

## 2016-02-27 NOTE — Tx Team (Signed)
Initial Interdisciplinary Treatment Plan   PATIENT STRESSORS: Financial difficulties Substance abuse   PATIENT STRENGTHS: Motivation for treatment/growth Physical Health Supportive family/friends   PROBLEM LIST: Problem List/Patient Goals Date to be addressed Date deferred Reason deferred Estimated date of resolution  Depression 02/27/16     Substance abuse 02/27/16     Anxiety 02/27/16     Suicidal ideation 02/27/16     "I want to read" 02/27/16     "I just want help" 02/27/16                        DISCHARGE CRITERIA:  Improved stabilization in mood, thinking, and/or behavior Verbal commitment to aftercare and medication compliance  PRELIMINARY DISCHARGE PLAN: Outpatient therapy Medication management  PATIENT/FAMIILY INVOLVEMENT: This treatment plan has been presented to and reviewed with the patient, Carl Orr.  The patient and family have been given the opportunity to ask questions and make suggestions.  Norm ParcelHeather V Preslyn Warr 02/27/2016, 4:19 PM

## 2016-02-28 DIAGNOSIS — F1020 Alcohol dependence, uncomplicated: Secondary | ICD-10-CM

## 2016-02-28 LAB — HIV ANTIBODY (ROUTINE TESTING W REFLEX): HIV SCREEN 4TH GENERATION: NONREACTIVE

## 2016-02-28 MED ORDER — MIRTAZAPINE 15 MG PO TABS
15.0000 mg | ORAL_TABLET | Freq: Every day | ORAL | Status: DC
  Administered 2016-02-28: 15 mg via ORAL
  Filled 2016-02-28 (×3): qty 1

## 2016-02-28 NOTE — [facility] Suicide Risk Assessment (Signed)
[facility]   Nursing information obtained from:  Patient Demographic factors:  Male Current Mental Status:  NA Loss Factors:  Financial problems / change in socioeconomic status Historical Factors:  Prior suicide attempts, Impulsivity Risk Reduction Factors:  Living with another person, especially a relative  Total Time spent with patient: 45 minutes Principal Problem: Alcohol Dependence, Alcohol Induced Mood Disorder, Depressed  Diagnosis:   Patient Active Problem List   Diagnosis Date Noted  . Alcohol use disorder, moderate, dependence (HCC) [F10.20] 02/27/2016     Continued Clinical Symptoms:  Alcohol Use Disorder Identification Test Final Score (AUDIT): 21 The "Alcohol Use Disorders Identification Test", Guidelines for Use in Primary Care, Second Edition.  World Science writerHealth Organization [facility](WHO). Score between 0-7:  no or low risk or alcohol related problems. Score between 8-15:  moderate risk of alcohol related problems. Score between 16-19:  high risk of alcohol related problems. Score 20 or above:  warrants further diagnostic evaluation for alcohol dependence and treatment.   CLINICAL FACTORS:  30 year old male, hearing impaired, who  Presented to ED reporting depression, passive suicidal ideations and alcohol dependence     Psychiatric Specialty Exam: Physical Exam  ROS  Blood pressure 109/73, pulse 70, temperature 98 F (36.7 C), temperature source Oral, resp. rate 18, height 5\' 7"  (1.702 m), weight 110 lb (49.896 kg), SpO2 100 %.Body mass index is 17.22 kg/(m^2).   see admit note MSE                                                         COGNITIVE FEATURES THAT CONTRIBUTE TO RISK:  Loss of executive function    SUICIDE RISK:   Moderate:  Frequent suicidal ideation with limited intensity, and duration, some specificity in terms of plans, no associated intent, good self-control, limited dysphoria/symptomatology, some  risk factors present, and identifiable protective factors, including available and accessible social support.  PLAN OF CARE: Patient will be admitted to inpatient psychiatric unit for stabilization and safety. Will provide and encourage milieu participation. Provide medication management and maked adjustments as needed.  Will also provide medication management to minimize risk of WDL .Will follow daily.    I certify that inpatient services furnished can reasonably be expected to improve the patient's condition.   Nehemiah MassedOBOS, James Lafalce, MD 02/28/2016, 11:50 AM

## 2016-02-28 NOTE — Progress Notes (Signed)
Recreation Therapy Notes  Date: 07.05.2017 Time: 9:30am Location: [address] Dayroom   Group Topic: Stress Management  Goal Area(s) Addresses:  Patient will actively participate in stress management techniques presented during session.   Behavioral Response: Did not attend.   Marykay Lexenise L Lakechia Nay, LRT/CTRS        Alysson Geist L 02/28/2016 10:13 AM

## 2016-02-28 NOTE — Progress Notes (Signed)
Pt has been in his room in bed all evening.  He was awake briefly and it was noted that pt was sweating profusely.  He has some slight tremors.  Conversation was minimal as pt is hearing impaired.  He voiced no needs or concerns this evening.  He was medicated per the detox protocol.  He was also provided gatorade at that time.  Pt denies SI/HI/AVH.  Pt was encouraged to make his needs known to staff.  Support and encouragement offered.  Discharge plans are in process.  Safety maintained with q15 minute checks.

## 2016-02-28 NOTE — [facility] Suicide Risk Assessment (Signed)
[facility] INPATIENT:  Family/Significant Other Suicide Prevention Education  Suicide Prevention Education:  Education Completed; Carl Orr (pt's sister) [phone] has been identified by the patient as the family member/significant other with whom the patient will be residing, and identified as the person(s) who will aid the patient in the event of a mental health crisis (suicidal ideations/suicide attempt).  With written consent from the patient, the family member/significant other has been provided the following suicide prevention education, prior to the and/or following the discharge of the patient.  The suicide prevention education provided includes the following:  Suicide risk factors  Suicide prevention and interventions  National Suicide Hotline telephone number  [facility] assessment telephone number  [facility] Emergency Assistance 911  [facility] and/or Residential Mobile Crisis Unit telephone number  Request made of family/significant other to:  Remove weapons (e.g., guns, rifles, knives), all items previously/currently identified as safety concern.    Remove drugs/medications (over-the-counter, prescriptions, illicit drugs), all items previously/currently identified as a safety concern.  The family member/significant other verbalizes understanding of the suicide prevention education information provided.  The family member/significant other agrees to remove the items of safety concern listed above.  Pt's sister plans to pick him up at d/c-likely Friday and has no concerns about pt discharging home. She verbalized understanding of SPE information and aftercare plan.   Smart, Carl Daily LCSW 02/28/2016, 4:13 PM

## 2016-02-28 NOTE — [facility] Group Notes (Signed)
[facility] LCSW Group Therapy  02/28/2016 3:30 PM  Type of Therapy:  Group Therapy  Participation Level:  Minimal  Participation Quality:  Attentive  Affect:  Appropriate  Cognitive:  Alert and Oriented  Insight:  Limited  Engagement in Therapy:  Limited  Modes of Intervention:  Confrontation, Discussion, Education, Exploration, Problem-solving, Rapport Building, Socialization and Support  Summary of Progress/Problems: Emotion Regulation: This group focused on both positive and negative emotion identification and allowed group members to process ways to identify feelings, regulate negative emotions, and find healthy ways to manage internal/external emotions. Group members were asked to reflect on a time when their reaction to an emotion led to a negative outcome and explored how alternative responses using emotion regulation would have benefited them. Group members were also asked to discuss a time when emotion regulation was utilized when a negative emotion was experienced. Carl Orr was attentive during group but minimally participated despite assistance from language interpretor. Carl Orr shared that his depression has worsened recently due to "people on the streets and my alcohol abuse. It's rough out there right now." He stated that "I really just need sleep and something to help me with sleep. I think I'll be all right with just that and changed my mind about rehab." Carl Orr continues to demonstrate limited insight and some resistance in the group setting.   Smart, Zylon Creamer LCSW 02/28/2016, 3:30 PM

## 2016-02-28 NOTE — Progress Notes (Signed)
D:Pt presents with flat affect and anxious mood. Pt rates depression 10/10. Pt endorses suicidal thoughts with no plan. Pt verbally contracts for safety. Pt reported that he is hard of hearing in his right ear. Pt verbally responds to questions. Translator present this morning during group time but pt was observed sleeping. Pt hesitant about taking meds this afternoon. Pt stated that he do not want to be discharged on a lot of meds. Writer explained to pt that scheduled vitamins and Ativan are part of a detox protocol. Pt denies withdrawal symptoms at this time. A: Medications reviewed with pt. Medications administered as ordered per MD. Verbal support provided. Pt encouraged to attend groups. 15 minute checks performed for safety.  R: Pt receptive to tx.

## 2016-02-28 NOTE — [facility] Counselor (Signed)
Adult Comprehensive Assessment  Patient ID: Carl BlinksKevin Orr, male   DOB: 18-Oct-1985, 30 y.o.   MRN: [id number]  Information Source: Information source: Patient  Current Stressors:  Educational / Learning stressors: high school Employment / Job issues: works under the table cleaning cars; mowing lawns Family Relationships: lives with sister; some family supports Surveyor, quantityinancial / Lack of resources (include bankruptcy): income from under table job; Medicare/medicaid Housing / Lack of housing: lives with his sister Physical health (include injuries & life threatening diseases): hard of hearing/deaf; abdominal surgery when an infant. back issues Social relationships: poor Substance abuse: alcohol and marijuana daily; sporatic cocaine use Bereavement / Loss: mother passed away few years ago. pt reports this is still a difficult loss for him.   Living/Environment/Situation:  Living Arrangements: Other relatives Living conditions (as described by patient or guardian): lives in apt with his sister How long has patient lived in current situation?: ongoing (unknown length of time)  What is atmosphere in current home: Comfortable, ParamedicLoving, Supportive  Family History:  Marital status: Single Are you sexually active?: No What is your sexual orientation?: heterosexual Has your sexual activity been affected by drugs, alcohol, medication, or emotional stress?: n/a  Does patient have children?: No  Childhood History:  By whom was/is the patient raised?: Mother, Grandparents Additional childhood history information: pt reports that his mother and grandmother primarily raised him. father was not in the picture.  Description of patient's relationship with caregiver when they were a child: close to mom/grandma Patient's description of current relationship with people who raised him/her: both mom and grandmother have passed away. pt reports still having difficulty from this loss (few years ago). no relationship  with father How were you disciplined when you got in trouble as a child/adolescent?: n/a  Does patient have siblings?: Yes Number of Siblings: 2 Description of patient's current relationship with siblings: one brother and one sister. Close to sister with whom he lives Did patient suffer any verbal/emotional/physical/sexual abuse as a child?: No Did patient suffer from severe childhood neglect?: No Has patient ever been sexually abused/assaulted/raped as an adolescent or adult?: No Was the patient ever a victim of a crime or a disaster?: No Witnessed domestic violence?: No Has patient been effected by domestic violence as an adult?: No  Education:  Highest grade of school patient has completed: high school  Currently a Consulting civil engineerstudent?: No Learning disability?: No  Employment/Work Situation:   Employment situation: Employed Where is patient currently employed?: works as Surveyor, quantityprivate contractor-landscaping and car cleaning How long has patient been employed?: few years Patient's job has been impacted by current illness: Yes Describe how patient's job has been impacted: drinking throughout day What is the longest time patient has a held a job?: see above Where was the patient employed at that time?: see above  Has patient ever been in the Eli Lilly and Companymilitary?: No Has patient ever served in combat?: No Did You Receive Any Psychiatric Treatment/Services While in Equities traderthe Military?: No Are There Guns or Other Weapons in Your Home?: No Are These Weapons Safely Secured?:  (n/a)  Financial Resources:   Financial resources: Income from employment, Support from parents / caregiver, Medicaid, Medicare, Dolores LoryReceives SSDI Does patient have a representative payee or guardian?: No  Alcohol/Substance Abuse:   What has been your use of drugs/alcohol within the last 12 months?: alcohol daily; marijuana and sporatic cocaine use.  If attempted suicide, did drugs/alcohol play a role in this?: No Alcohol/Substance Abuse Treatment Hx:  Denies past history If yes, describe treatment: n/a  Has alcohol/substance abuse ever caused legal problems?: No ('in the past but nothing now.")  Social Support System:   Patient's Community Support System: Fair Museum/gallery exhibitions officerDescribe Community Support System: few friends; most are drinking or using drugs and are not good influences on patient Type of faith/religion: christian How does patient's faith help to cope with current illness?: Optometristprayer/church  Leisure/Recreation:   Leisure and Hobbies: spending time with sister;at home relatxing  Strengths/Needs:   What things does the patient do well?: hard worker;  In what areas does patient struggle / problems for patient: coping with grief/loss/sleep issues.   Discharge Plan:   Does patient have access to transportation?: Yes Will patient be returning to same living situation after discharge?: Yes Currently receiving community mental health services: No If no, would patient like referral for services when discharged?: Yes (What county?) [place] Does patient have financial barriers related to discharge medications?: No (pt has medicare and [place] Medicaid)  Summary/Recommendations:   Emergency planning/management officerummary and Recommendations (to be completed by the evaluator): Patient is 30 year old [place]. Patient presents to the hospital seeking treatment for Si with a plan to overdose, increased depression, alcohol/marijuana abuse and sporatic cocaine use, and for medication stabilization. Patient is open to taking Remeron to assist with sleep/mood/appettie. Pt reports losing over 20lbs recently. Recommendations for patient include: crisis stablization, therapeutic milieu, encourage group attendance and participation, medication management for withdrawals/mood stabilization, and development of comprehensive mental wellness/sobriety plan.   Carl Orr, Carl Orr 02/28/2016 12:53 PM

## 2016-02-28 NOTE — Tx Team (Signed)
Interdisciplinary Treatment Plan Update (Adult)  Date:  02/28/2016  Time Reviewed:  8:32 AM   Progress in Treatment: Attending groups: Yes. Participating in groups:  Yes. Taking medication as prescribed:  Yes. Tolerating medication:  Yes. Family/Significant othe contact made:  SPE required for this pt.  Patient understands diagnosis:  Yes. and As evidenced by:  seeking treatment for alcohol abuse, SI, depression, and for medication stabilization. Discussing patient identified problems/goals with staff:  Yes. Medical problems stabilized or resolved:  Yes. Denies suicidal/homicidal ideation: Yes. Issues/concerns per patient self-inventory:  Other:  Discharge Plan or Barriers: CSW assessing for appropriate referrals.   Reason for Continuation of Hospitalization: Depression Medication stabilization Withdrawals   Comments:  Carl Orr is an 30 y.o. male presenting to [facility] reporting suicidal ideation with a plan to overdose. Pt reported that he attempted suicide by cutting 7 or 8 years ago. PT did not report any self-injurious behaviors or a family history of suicide. PT did not report any current mental health treatment but shared that he was hospitalized after his suicide attempt. Pt denies HI and AVH at this time. Pt reported that his alcohol use is a stressor and is endorsing multiple depressive symptoms. Pt reported that he has experienced some weight loss and believes it was approximately 20lbs. Pt reported daily alcohol and marijuana use and shared that he uses cocaine "every now and then". Pt did not report any physical, sexual or emotional abuse at this time.  Diagnosis: Major Depressive Disorder, Recurrent episode, without psychosis; Cannabis Use Disorder, Alcohol Use Disorder   Estimated length of stay:  3-5 days   New goal(s): to develop effective aftercare plan.   Additional Comments:  Patient and CSW reviewed pt's identified goals and treatment plan. Patient verbalized  understanding and agreed to treatment plan. CSW reviewed [facility] "Discharge Process and Patient Involvement" Form. Pt verbalized understanding of information provided and signed form.    Review of initial/current patient goals per problem list:  1. Goal(s): Patient will participate in aftercare plan  Met: No.   Target date: at discharge  As evidenced by: Patient will participate within aftercare plan AEB aftercare provider and housing plan at discharge being identified.  7/5: CSW assessing for appropriate referrals.   2. Goal (s): Patient will exhibit decreased depressive symptoms and suicidal ideations.  Met: No.    Target date: at discharge  As evidenced by: Patient will utilize self rating of depression at 3 or below and demonstrate decreased signs of depression or be deemed stable for discharge by MD.  7/5: Pt rates depression as high. Denies SI/HI/AVH.   3. Goal(s): Patient will demonstrate decreased signs of withdrawal due to substance abuse  Met:No.   Target date:at discharge   As evidenced by: Patient will produce a CIWA/COWS score of 0, have stable vitals signs, and no symptoms of withdrawal.  7/5: Pt reports moderate withdrawals with CIWA score of 6 and low BP/pusle (sitting).   Attendees: Patient:   02/28/2016 8:32 AM   Family:   02/28/2016 8:32 AM   Physician: Dr. Parke Poisson MD  02/28/2016 8:32 AM   Nursing:   Merlene Morse RN 02/28/2016 8:32 AM   Clinical Social Worker: Maxie Better, LCSW 02/28/2016 8:32 AM   Clinical Social Worker: Erasmo Downer Drinkard LCSW 02/28/2016 8:32 AM   Other:  Agustina Caroli NP 02/28/2016 8:32 AM   Other: 02/28/2016 8:32 AM   Other:   02/28/2016 8:32 AM   Other:  02/28/2016 8:32 AM   Other:  02/28/2016 8:32  AM   Other:  02/28/2016 8:32 AM    02/28/2016 8:32 AM    02/28/2016 8:32 AM    02/28/2016 8:32 AM    02/28/2016 8:32 AM    Scribe for Treatment Team:   Maxie Better, LCSW 02/28/2016 8:32 AM

## 2016-02-28 NOTE — H&P (Signed)
Psychiatric Admission Assessment Adult  Patient Identification: Carl Orr MRN:  [id number] Date of Evaluation:  02/28/2016 Chief Complaint:   " I need help" Principal Diagnosis:  Alcohol Dependence, Alcohol Induced Mood Disorder, Depressed  Diagnosis:   Patient Active Problem List   Diagnosis Date Noted  . Alcohol use disorder, moderate, dependence ([place]) [F10.20] 02/27/2016   History of Present Illness: 30 year old single male, lives with sister. Patient is hearing impaired, and was seen with sign language interpreter. Patient presented to ED voluntarily, complaining of syncopal episode, but also reported alcohol abuse, depression, suicidal ideations. He reports he has a history of alcohol dependence, and is " very tired " of alcohol consumption . Several times during interview stated that his goal was to stop drinking and to stay away from the people he has been associating with , as they drink regularly. He reports he has been depressed, and that he had some passive suicidal ideations on day of admission, but currently denies any SI and contracts for safety on unit. He endorses some neuro-vegetative symptoms as below. Associated Signs/Symptoms: Depression Symptoms:  depressed mood, suicidal thoughts without plan, mild anhedonia  Poor sleep Denies significant changes in energy level or  appetite  (Hypo) Manic Symptoms:  denies  Anxiety Symptoms:  reports free floating anxiety, denies panic  Psychotic Symptoms:  denies  PTSD Symptoms: does not endorse Total Time spent with patient: 45 minutes  Past Psychiatric History: patient reports he had one prior psychiatric admission in 2012, he denies any history of suicide attempts or self injurious behaviors, denies history of mania, denies history of psychosis, denies history of violence .   Is the patient at risk to self? Yes.    Has the patient been a risk to self in the past 6 months? No.  Has the patient been a risk to self within the  distant past? No.  Is the patient a risk to others? No.  Has the patient been a risk to others in the past 6 months? No.  Has the patient been a risk to others within the distant past? No.   Prior Inpatient Therapy:  as above Prior Outpatient Therapy:  none  Alcohol Screening: 1. How often do you have a drink containing alcohol?: 4 or more times a week 2. How many drinks containing alcohol do you have on a typical day when you are drinking?: 5 or 6 3. How often do you have six or more drinks on one occasion?: Weekly Preliminary Score: 5 4. How often during the last year have you found that you were not able to stop drinking once you had started?: Weekly 5. How often during the last year have you failed to do what was normally expected from you becasue of drinking?: Never 6. How often during the last year have you needed a first drink in the morning to get yourself going after a heavy drinking session?: Never 7. How often during the last year have you had a feeling of guilt of remorse after drinking?: Weekly 8. How often during the last year have you been unable to remember what happened the night before because you had been drinking?: Daily or almost daily 9. Have you or someone else been injured as a result of your drinking?: No 10. Has a relative or friend or a doctor or another health worker been concerned about your drinking or suggested you cut down?: Yes, but not in the last year Alcohol Use Disorder Identification Test Final Score (AUDIT):  21 Brief Intervention: Patient declined brief intervention Substance Abuse History in the last 12 months:  Yes.   endorses alcohol dependence, drinks 5 days a week , mainly liquor , up to 5-6 shots per day. Last drank day prior to admission . Denies drug abuse . 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 Deaf     Past Surgical History  Procedure Laterality Date  . Abdominal surgery      at birth  . Testicle torsion reduction    . Knee surgery     Family History: mother passed away a few years ago, father alive but patient has no contact with him, one brother and one sister  Family Psychiatric  History: uncle alcoholic, no suicides in family  Tobacco Screening: smokes 1 PPD  Social History: single, no children, lives with sister, works in [employer] and with a [employer] , denies legal issues  History  Alcohol Use No     History  Drug Use No    Additional Social History:      Pain Medications: denies Prescriptions: denies Over the Counter: denies History of alcohol / drug use?: Yes Longest period of sobriety (when/how long): Unknown Negative Consequences of Use: Financial, Personal relationships Name of Substance 1: Cocaine  1 - Age of First Use: 21 1 - Amount (size/oz): $10  1 - Frequency: "once in a while"  1 - Duration: ongoing  1 - Last Use / Amount: 02-26-16 Name of Substance 2: THC  2 - Age of First Use: 16 2 - Amount (size/oz): "a lot"  2 - Frequency: daily   2 - Duration: ongoing  2 - Last Use / Amount: 02-26-16 Name of Substance 3: Alcohol  3 - Age of First Use: 22 3 - Amount (size/oz): 2 40oz 3 - Frequency: daily 3 - Duration: Unknown 3 - Last Use / Amount: 02/26/16              Allergies:   Allergies  Allergen Reactions  . Hydrocodone Anaphylaxis   Lab Results:  Results for orders placed or performed during the hospital encounter of 02/26/16 (from the past 48 hour(s))  CBC with Differential     Status: Abnormal   Collection Time: 02/26/16  7:30 PM  Result Value Ref Range   WBC 14.3 (H) 4.0 - 10.5 K/uL   RBC 4.82 4.22 -  5.81 MIL/uL   Hemoglobin 14.5 13.0 - 17.0 g/dL   HCT 43.3 39.0 - 52.0 %   MCV 89.8 78.0 - 100.0 fL   MCH 30.1 26.0 - 34.0 pg   MCHC 33.5 30.0 - 36.0 g/dL   RDW 13.7 11.5 - 15.5 %   Platelets 162 150 - 400 K/uL   Neutrophils Relative % 86 %   Neutro Abs 12.3 (H) 1.7 - 7.7 K/uL   Lymphocytes Relative 9 %   Lymphs Abs 1.3 0.7 - 4.0 K/uL   Monocytes Relative 5 %   Monocytes Absolute 0.7 0.1 - 1.0 K/uL   Eosinophils Relative 0 %   Eosinophils Absolute 0.0 0.0 - 0.7 K/uL   Basophils Relative 0 %   Basophils Absolute 0.0 0.0 - 0.1 K/uL  Basic metabolic  panel     Status: Abnormal   Collection Time: 02/26/16  7:30 PM  Result Value Ref Range   Sodium 138 135 - 145 mmol/L   Potassium 4.2 3.5 - 5.1 mmol/L   Chloride 106 101 - 111 mmol/L   CO2 21 (L) 22 - 32 mmol/L   Glucose, Bld 90 65 - 99 mg/dL   BUN 18 6 - 20 mg/dL   Creatinine, Ser 1.50 (H) 0.61 - 1.24 mg/dL   Calcium 9.8 8.9 - 10.3 mg/dL   GFR calc non Af Amer >60 >60 mL/min   GFR calc Af Amer >60 >60 mL/min    Comment: (NOTE) The eGFR has been calculated using the CKD EPI equation. This calculation has not been validated in all clinical situations. eGFR's persistently <60 mL/min signify possible Chronic Kidney Disease.    Anion gap 11 5 - 15  POC CBG, ED     Status: None   Collection Time: 02/26/16  7:30 PM  Result Value Ref Range   Glucose-Capillary 89 65 - 99 mg/dL   Comment 1 Notify RN    Comment 2 Document in Chart   I-Stat Troponin, ED - 0, 3, 6 hours (not at [facility])     Status: None   Collection Time: 02/26/16  7:34 PM  Result Value Ref Range   Troponin i, poc 0.04 0.00 - 0.08 ng/mL   Comment 3            Comment: Due to the release kinetics of cTnI, a negative result within the first hours of the onset of symptoms does not rule out myocardial infarction with certainty. If myocardial infarction is still suspected, repeat the test at appropriate intervals.   Acetaminophen level     Status: Abnormal   Collection  Time: 02/26/16  8:20 PM  Result Value Ref Range   Acetaminophen (Tylenol), Serum <10 (L) 10 - 30 ug/mL    Comment:        THERAPEUTIC CONCENTRATIONS VARY SIGNIFICANTLY. A RANGE OF 10-30 ug/mL MAY BE AN EFFECTIVE CONCENTRATION FOR MANY PATIENTS. HOWEVER, SOME ARE BEST TREATED AT CONCENTRATIONS OUTSIDE THIS RANGE. ACETAMINOPHEN CONCENTRATIONS >150 ug/mL AT 4 HOURS AFTER INGESTION AND >50 ug/mL AT 12 HOURS AFTER INGESTION ARE OFTEN ASSOCIATED WITH TOXIC REACTIONS.   Salicylate level     Status: None   Collection Time: 02/26/16  8:20 PM  Result Value Ref Range   Salicylate Lvl <2.7 2.8 - 30.0 mg/dL  Ethanol     Status: None   Collection Time: 02/26/16  8:20 PM  Result Value Ref Range   Alcohol, Ethyl (B) <5 <5 mg/dL    Comment:        LOWEST DETECTABLE LIMIT FOR SERUM ALCOHOL IS 5 mg/dL FOR MEDICAL PURPOSES ONLY   I-Stat Troponin, ED - 0, 3, 6 hours (not at [facility])     Status: None   Collection Time: 02/26/16 10:09 PM  Result Value Ref Range   Troponin i, poc 0.03 0.00 - 0.08 ng/mL   Comment 3            Comment: Due to the release kinetics of cTnI, a negative result within the first hours of the onset of symptoms does not rule out myocardial infarction with certainty. If myocardial infarction is still suspected, repeat the test at appropriate intervals.   I-Stat Troponin, ED - 0, 3, 6 hours (not at [facility])     Status: None   Collection Time: 02/27/16  1:36 AM  Result Value Ref  Range   Troponin i, poc 0.02 0.00 - 0.08 ng/mL   Comment 3            Comment: Due to the release kinetics of cTnI, a negative result within the first hours of the onset of symptoms does not rule out myocardial infarction with certainty. If myocardial infarction is still suspected, repeat the test at appropriate intervals.     Blood Alcohol level:  Lab Results  Component Value Date   ETH <5 99/83/3825    Metabolic Disorder Labs:  No results found for: HGBA1C, MPG No results found for:  PROLACTIN No results found for: CHOL, TRIG, HDL, CHOLHDL, VLDL, LDLCALC  Current Medications: Current Facility-Administered Medications  Medication Dose Route Frequency Provider Last Rate Last Dose  . acetaminophen (TYLENOL) tablet 650 mg  650 mg Oral Q6H PRN Niel Hummer, NP      . alum & mag hydroxide-simeth (MAALOX/MYLANTA) 200-200-20 MG/5ML suspension 30 mL  30 mL Oral Q4H PRN Niel Hummer, NP      . feeding supplement (ENSURE ENLIVE) (ENSURE ENLIVE) liquid 237 mL  237 mL Oral BID BM Jenne Campus, MD   237 mL at 02/27/16 1703  . hydrOXYzine (ATARAX/VISTARIL) tablet 25 mg  25 mg Oral Q6H PRN Niel Hummer, NP      . loperamide (IMODIUM) capsule 2-4 mg  2-4 mg Oral PRN Niel Hummer, NP      . LORazepam (ATIVAN) tablet 1 mg  1 mg Oral Q6H PRN Niel Hummer, NP      . LORazepam (ATIVAN) tablet 1 mg  1 mg Oral TID Niel Hummer, NP   1 mg at 02/28/16 1006   Followed by  . [START ON 02/29/2016] LORazepam (ATIVAN) tablet 1 mg  1 mg Oral BID Niel Hummer, NP       Followed by  . [START ON 03/01/2016] LORazepam (ATIVAN) tablet 1 mg  1 mg Oral Daily Niel Hummer, NP      . magnesium hydroxide (MILK OF MAGNESIA) suspension 30 mL  30 mL Oral Daily PRN Niel Hummer, NP      . mirtazapine (REMERON) tablet 15 mg  15 mg Oral QHS Jenne Campus, MD      . multivitamin with minerals tablet 1 tablet  1 tablet Oral Daily Niel Hummer, NP   1 tablet at 02/27/16 1455  . ondansetron (ZOFRAN-ODT) disintegrating tablet 4 mg  4 mg Oral Q6H PRN Niel Hummer, NP      . thiamine (VITAMIN B-1) tablet 100 mg  100 mg Oral Daily Niel Hummer, NP       PTA Medications: Prescriptions prior to admission  Medication Sig Dispense Refill Last Dose  . diphenhydrAMINE (BENADRYL) 25 MG tablet Take 1 tablet (25 mg total) by mouth every 6 (six) hours. (Patient not taking: Reported on 02/26/2016) 20 tablet 0 Not Taking at Unknown time  . hydrocortisone cream 1 % Apply to affected area 2 times daily (Patient not taking:  Reported on 02/26/2016) 15 g 0 Not Taking at Unknown time    Musculoskeletal: Strength & Muscle Tone: within normal limits- at this time patient does not present with tremors, no diaphoresis, no acute distress  Gait & Station: normal Patient leans: N/A  Psychiatric Specialty Exam: Physical Exam  Review of Systems  Constitutional: Negative.   HENT: Negative.   Eyes: Negative.   Respiratory: Negative.   Cardiovascular: Negative.   Gastrointestinal: Negative.   Genitourinary:  Negative.   Musculoskeletal: Negative.   Skin: Negative.   Neurological: Negative for seizures.  Psychiatric/Behavioral: Positive for depression and substance abuse.  All other systems reviewed and are negative.   Blood pressure 109/73, pulse 70, temperature 98 F (36.7 C), temperature source Oral, resp. rate 18, height _0  (1.702 m), weight 110 lb (49.896 kg), SpO2 100 %.Body mass index is 17.22 kg/(m^2).  General Appearance: Fairly Groomed  Eye Contact:  Good  Speech:  Communicates via sign language   Volume:  NA  Mood:  Depressed, but states feeling better now   Affect:  constricted, but does smile briefly at times   Thought Process:  Linear  Orientation:  Other:  fully alert, attentive   Thought Content:  no hallucinations, no delusions expressed, not internally preoccupied   Suicidal Thoughts:  No at present denies any suicidal ideations, denies any self injurious ideations , denies any homicidal or violent ideations   Homicidal Thoughts:  No  Memory:  recent and remote grossly intact   Judgement:  Other:  present  Insight:  Present  Psychomotor Activity:  Normal- no significant withdrawal at this time   Concentration:  Concentration: Good and Attention Span: Good  Recall:  Good  Fund of Knowledge:  Good  Language:  Good  Akathisia:  Negative  Handed:  Right  AIMS (if indicated):     Assets:  Desire for Improvement Resilience  ADL's:  Intact  Cognition:  WNL  Sleep:  Number of Hours: 6        Treatment Plan Summary: Daily contact with patient to assess and evaluate symptoms and progress in treatment, Medication management, Plan inpatient treatment  and medications as below   Observation Level/Precautions:  15 minute checks  Laboratory:  Will repeat BMP to monitor Creatinine level   Psychotherapy:  Milieu, support   Medications:  On Ativan detox protocol to minimize risk of WDL, patient agrees to Remeron for depression, insomnia  Offered Ensure supplementation, but patient declined, stated causes him nausea   Consultations:  As needed   Discharge Concerns: -   Estimated LOS: 5 days   Other:     I certify that inpatient services furnished can reasonably be expected to improve the patient's condition.    Neita Garnet, MD 7/5/201711:32 AM

## 2016-02-28 NOTE — Progress Notes (Signed)
Patient attended N/A group tonight.  

## 2016-02-28 NOTE — Progress Notes (Signed)
Pt has been up and visible in the milieu this evening.  He reports that he feels better this evening.  He states his withdrawal symptoms are decreasing.  He had an interpreter for the group tonight, but is able to talk with writer 1:1 without any problems.  He states he is still a little anxious and would like something to help relax him.  He was given Vistaril 25 mg along with his sleep aid before going to bed.  Pt is pleasant and cooperative with staff.  Support and encouragement offered.  Pt encouraged to make his needs known to staff.  Discharge plans are in process.  Safety maintained with q15 minute checks.

## 2016-02-28 NOTE — Progress Notes (Signed)
NUTRITION ASSESSMENT  Pt identified as at risk on the Malnutrition Screen Tool  INTERVENTION: 1. Educated patient on the importance of nutrition and encouraged intake of food and beverages. 2. Discussed weight goals. 3. Supplements: continue Ensure Enlive po BID, each supplement provides 350 kcal and 20 grams of protein   NUTRITION DIAGNOSIS: Unintentional weight loss related to sub-optimal intake as evidenced by pt report.   Goal: Pt to meet >/= 90% of their estimated nutrition needs.  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 Per notes, on admission pt had requested help with drug detox stating it was for "anything and everything."  Pt admitted with SI and notes indicate attempted suicide 7-8 years ago.  Pt reports ~20 lb weight loss in unknown time frame. Based on CBW, this would indicate 15.4% body weight loss in unknown time frame which is likely significant. Ensure Shiela Mayernlive has already been ordered BID.   30 y.o. male  Height: Ht Readings from Last 1 Encounters:  02/27/16 5\' 7"  (1.702 m)    Weight: Wt Readings from Last 1 Encounters:  02/27/16 110 lb (49.896 kg)    Weight Hx: Wt Readings from Last 10 Encounters:  02/27/16 110 lb (49.896 kg)  01/28/14 100 lb (45.36 kg)    BMI:  Body mass index is 17.22 kg/(m^2). Pt meets criteria for underweight based on current BMI.  Estimated Nutritional Needs: Kcal: 25-30 kcal/kg Protein: > 1 gram protein/kg Fluid: 1 ml/kcal  Diet Order: Diet regular Room service appropriate?: Yes; Fluid consistency:: Thin Pt is also offered choice of unit snacks mid-morning and mid-afternoon.  Pt is eating as desired.   Lab results and medications reviewed.      Trenton GammonJessica Selmer Adduci, MS, RD, LDN Inpatient Clinical Dietitian Pager # [phone] After hours/weekend pager # [phone]

## 2016-02-29 LAB — BASIC METABOLIC PANEL
ANION GAP: 4 — AB (ref 5–15)
BUN: 13 mg/dL (ref 6–20)
CALCIUM: 9 mg/dL (ref 8.9–10.3)
CO2: 27 mmol/L (ref 22–32)
Chloride: 108 mmol/L (ref 101–111)
Creatinine, Ser: 1.01 mg/dL (ref 0.61–1.24)
GLUCOSE: 90 mg/dL (ref 65–99)
Potassium: 3.8 mmol/L (ref 3.5–5.1)
Sodium: 139 mmol/L (ref 135–145)

## 2016-02-29 NOTE — Progress Notes (Signed)
D Caryn BeeKevin is seen standing in the 300 hall this morning. He does not respond when this nurse speaks to him. When he approaches the med window, after being prompted by MHT  ( to be given his am medications ) he looks down and away from this Clinical research associatewriter as Clinical research associatewriter speaks to him. Interpreter is  Standing to patient's left and begins signing for patient as this Clinical research associatewriter speaks and patient continues to look down and then ..away from the interpreter ( to the righ )t. This writer continues to speak to patient, explaining medications, asking assessment questions.Patient begins to glance.at interpreter and respond , shaking and moving his hands, shaking his head, as he is answering writer's questions. A With this writer's assistance, he answer's assessment questions, saying he has had no suicidal ideation today and rating his depression, hopelessness and anxiety " 1/  /10", respectively. R Safety in place.

## 2016-02-29 NOTE — [facility] Group Notes (Signed)
[facility] LCSW Group Therapy  02/29/2016 3:32 PM  Type of Therapy:  Group Therapy  Participation Level:  Minimal  Participation Quality:  Inattentive  Affect:  Appropriate  Cognitive:  Lacking  Insight:  Limited  Engagement in Therapy:  Limited  Modes of Intervention:  Confrontation, Discussion, Education, Exploration, Problem-solving, Rapport Building, Socialization and Support  Summary of Progress/Problems:  Finding Balance in Life. Today's group focused on defining balance in one's own words, identifying things that can knock one off balance, and exploring healthy ways to maintain balance in life. Group members were asked to provide an example of a time when they felt off balance, describe how they handled that situation,and process healthier ways to regain balance in the future. Group members were asked to share the most important tool for maintaining balance that they [facility] and how they plan to apply this method after discharge. Caryn BeeKevin was inattentive and disengaged during today's processing group. He was resistant to active participation but watched as sign language interpretor signed to him. He reports no needs and states that he may want to discharge on Friday. Limited insight at this time. He appears to be anxious in the group setting.    Smart, Laydon Martis LCSW 02/29/2016, 3:32 PM

## 2016-02-29 NOTE — Progress Notes (Signed)
[facility] MD Progress Note  02/29/2016 1:56 PM Carl Orr  MRN:  [id number] Subjective:   Hearing impaired language interpreter present.  Patient states that he did not feel he needed any medications nor rehab at the moment.  He states that he plans to be close to and under the watchful eye of her sister.  He states that he is close to his uncle and enjoys spending time with his nieces and nephews.  He states that they will help him avoid drinking.  Objective:  Carl Orr, 30 year old single male, lives with sister. Patient is hearing impaired. Presented to ED syncopal episode.  Reported "tired of being an alcoholic." Seen today, he reports that his family will help him through his alcohol problem.  He states that the medications is not needed at this time.  He would like to be discharged tomorrow.  He has a good support system.  He reports that it was "bad acquaintances" that made him drink   Principal Problem: Alcohol use disorder, moderate, dependence ([place]) Diagnosis:   Patient Active Problem List   Diagnosis Date Noted  . Alcohol use disorder, moderate, dependence ([place]) [F10.20] 02/27/2016    Priority: High   Total Time spent with patient: 30 minutes  Past Psychiatric History: see HPI  Past Medical History:  Past Medical History  Diagnosis Date  . Deaf     Past Surgical History  Procedure Laterality Date  . Abdominal surgery      at birth  . Testicle torsion reduction    . Knee surgery     Family History: History reviewed. No pertinent family history. Family Psychiatric  History:  See HPI Social History:  History  Alcohol Use No     History  Drug Use No    Social History   Social History  . Marital Status: Single    Spouse Name: N/A  . Number of Children: N/A  . Years of Education: N/A   Social History Main Topics  . Smoking status: Current Every Day Smoker -- 0.50 packs/day    Types: Cigarettes  . Smokeless tobacco: None  . Alcohol Use: No  . Drug Use: No  .  Sexual Activity: Not Asked   Other Topics Concern  . None   Social History Narrative   Additional Social History:    Pain Medications: denies Prescriptions: denies Over the Counter: denies History of alcohol / drug use?: Yes Longest period of sobriety (when/how long): Unknown Negative Consequences of Use: Financial, Personal relationships Name of Substance 1: Cocaine  1 - Age of First Use: 21 1 - Amount (size/oz): $10  1 - Frequency: "once in a while"  1 - Duration: ongoing  1 - Last Use / Amount: 02-26-16 Name of Substance 2: THC  2 - Age of First Use: 16 2 - Amount (size/oz): "a lot"  2 - Frequency: daily   2 - Duration: ongoing  2 - Last Use / Amount: 02-26-16 Name of Substance 3: Alcohol  3 - Age of First Use: 22 3 - Amount (size/oz): 2 40oz 3 - Frequency: daily 3 - Duration: Unknown 3 - Last Use / Amount: 02/26/16              Sleep: Good  Appetite:  Good  Current Medications: Current Facility-Administered Medications  Medication Dose Route Frequency Provider Last Rate Last Dose  . acetaminophen (TYLENOL) tablet 650 mg  650 mg Oral Q6H PRN Niel Hummer, NP      . alum &  mag hydroxide-simeth (MAALOX/MYLANTA) 200-200-20 MG/5ML suspension 30 mL  30 mL Oral Q4H PRN Niel Hummer, NP      . feeding supplement (ENSURE ENLIVE) (ENSURE ENLIVE) liquid 237 mL  237 mL Oral BID BM Myer Peer Cobos, MD   237 mL at 02/29/16 1000  . hydrOXYzine (ATARAX/VISTARIL) tablet 25 mg  25 mg Oral Q6H PRN Niel Hummer, NP   25 mg at 02/28/16 2124  . loperamide (IMODIUM) capsule 2-4 mg  2-4 mg Oral PRN Niel Hummer, NP      . LORazepam (ATIVAN) tablet 1 mg  1 mg Oral Q6H PRN Niel Hummer, NP      . LORazepam (ATIVAN) tablet 1 mg  1 mg Oral BID Niel Hummer, NP   1 mg at 02/29/16 0813   Followed by  . [START ON 03/01/2016] LORazepam (ATIVAN) tablet 1 mg  1 mg Oral Daily Niel Hummer, NP      . magnesium hydroxide (MILK OF MAGNESIA) suspension 30 mL  30 mL Oral Daily PRN Niel Hummer,  NP      . mirtazapine (REMERON) tablet 15 mg  15 mg Oral QHS Jenne Campus, MD   15 mg at 02/28/16 2122  . multivitamin with minerals tablet 1 tablet  1 tablet Oral Daily Niel Hummer, NP   1 tablet at 02/29/16 [phone]  . ondansetron (ZOFRAN-ODT) disintegrating tablet 4 mg  4 mg Oral Q6H PRN Niel Hummer, NP      . thiamine (VITAMIN B-1) tablet 100 mg  100 mg Oral Daily Niel Hummer, NP   100 mg at 02/29/16 6010    Lab Results:  Results for orders placed or performed during the hospital encounter of 02/27/16 (from the past 48 hour(s))  Basic metabolic panel     Status: Abnormal   Collection Time: 02/29/16  6:23 AM  Result Value Ref Range   Sodium 139 135 - 145 mmol/L   Potassium 3.8 3.5 - 5.1 mmol/L   Chloride 108 101 - 111 mmol/L   CO2 27 22 - 32 mmol/L   Glucose, Bld 90 65 - 99 mg/dL   BUN 13 6 - 20 mg/dL   Creatinine, Ser 1.01 0.61 - 1.24 mg/dL   Calcium 9.0 8.9 - 10.3 mg/dL   GFR calc non Af Amer >60 >60 mL/min   GFR calc Af Amer >60 >60 mL/min    Comment: (NOTE) The eGFR has been calculated using the CKD EPI equation. This calculation has not been validated in all clinical situations. eGFR's persistently <60 mL/min signify possible Chronic Kidney Disease.    Anion gap 4 (L) 5 - 15    Comment: Performed at [facility]    Blood Alcohol level:  Lab Results  Component Value Date   [facility] <5 93/23/5573    Metabolic Disorder Labs: No results found for: HGBA1C, MPG No results found for: PROLACTIN No results found for: CHOL, TRIG, HDL, CHOLHDL, VLDL, LDLCALC  Physical Findings: AIMS: Facial and Oral Movements Muscles of Facial Expression: None, normal Lips and Perioral Area: None, normal Jaw: None, normal Tongue: None, normal,Extremity Movements Upper (arms, wrists, hands, fingers): None, normal Lower (legs, knees, ankles, toes): None, normal, Trunk Movements Neck, shoulders, hips: None, normal, Overall Severity Severity of abnormal movements  (highest score from questions above): None, normal Incapacitation due to abnormal movements: None, normal Patient's awareness of abnormal movements (rate only patient's report): No Awareness, Dental Status Current problems with teeth and/or  dentures?: No Does patient usually wear dentures?: No  CIWA:  CIWA-Ar Total: 4 COWS:     Musculoskeletal: Strength & Muscle Tone: within normal limits Gait & Station: normal Patient leans: N/A  Psychiatric Specialty Exam: Physical Exam  Nursing note and vitals reviewed.   ROS  Blood pressure 131/64, pulse 70, temperature 97.5 F (36.4 C), temperature source Oral, resp. rate 18, height 5' 7" (1.702 m), weight 49.896 kg (110 lb), SpO2 100 %.Body mass index is 17.22 kg/(m^2).  General Appearance: Fairly Groomed  Eye Contact:  Good  Speech:  sign language  Volume:  NA  Mood:  Depressed  Affect:  Constricted  Thought Process:  Linear  Orientation:  Full (Time, Place, and Person)  Thought Content:  Rumination  Suicidal Thoughts:  No  Homicidal Thoughts:  No  Memory:  Immediate;   Fair Recent;   Fair Remote;   Fair  Judgement:  Fair  Insight:  Present  Psychomotor Activity:  Normal  Concentration:  Concentration: Good and Attention Span: Good  Recall:  Good  Fund of Knowledge:  Good  Language:  Poor  Akathisia:  Negative  Handed:  Right  AIMS (if indicated):     Assets:  Desire for Improvement Resilience Social Support  ADL's:  Intact  Cognition:  WNL  Sleep:  Number of Hours: 6.5   Treatment Plan Summary: Review of chart, vital signs, medications, and notes.  1-Individual and group therapy  2-Medication management for depression and anxiety: Medications revieweds.  DC Remeron..  3-Coping skills for depression, anxiety  4-Continue crisis stabilization and management  5-Address health issues--monitoring vital signs, stable  6-Treatment plan in progress to prevent relapse of depression and anxiety  Janett Labella, NP  [facility] 02/29/2016, 1:56 PM Agree with NP progress note as above

## 2016-02-29 NOTE — [facility] Group Notes (Addendum)
[facility] Group Notes:  (Nursing/MHT/Case Management/Adjunct)  Date:  02/29/2016  Time:  0900 Type of Therapy:  Recovery Group:  The group focuses on teaching patients the need to change unhealthy behaviors and involved patients identifying what behavior they will change.  Participation Level:  Attentive  Participation Quality:  Pt quiet  Affect:  Flat, blunted  Cognitive:  Unsure  Insight:  Unable to ascertin  Engagement in Group:  Pt attentive to speaker. Incongruently attentive to interpreter.   Modes of Intervention:  Discussion  Summary of Progress/Problems:  Rich BraveDuke, Lauretta Sallas Lynn 02/29/2016, 11:11 AM

## 2016-03-01 MED ORDER — HYDROXYZINE HCL 25 MG PO TABS: 25.0000 mg | ORAL_TABLET | Freq: Four times a day (QID) | ORAL | Status: AC | PRN

## 2016-03-01 NOTE — Discharge Summary (Signed)
Physician Discharge Summary Note  Patient:  Carl Orr is an 30 y.o., male MRN:  [id number] DOB:  1986-02-16 Patient phone:  [phone] (home)  Patient address:   [address] [place] [address],  Total Time spent with patient: 30 minutes  Date of Admission:  02/27/2016 Date of Discharge: 03/01/2016   Reason for Admission:  Alcohol abuse, drug abuse  Principal Problem: Alcohol use disorder, moderate, dependence ([facility]) Discharge Diagnoses: Patient Active Problem List   Diagnosis Date Noted  . Alcohol use disorder, moderate, dependence ([facility]) [F10.20] 02/27/2016    Priority: High    Past Psychiatric History: see HPI  Past Medical History:  Past Medical History  Diagnosis Date  . Deaf     Past Surgical History  Procedure Laterality Date  . Abdominal surgery      at birth  . Testicle torsion reduction    . Knee surgery     Family History: History reviewed. No pertinent family history. Family Psychiatric  History: see HPI Social History:  History  Alcohol Use No     History  Drug Use No    Social History   Social History  . Marital Status: Single    Spouse Name: N/A  . Number of Children: N/A  . Years of Education: N/A   Social History Main Topics  . Smoking status: Current Every Day Smoker -- 0.50 packs/day    Types: Cigarettes  . Smokeless tobacco: None  . Alcohol Use: No  . Drug Use: No  . Sexual Activity: Not Asked   Other Topics Concern  . None   Social History Narrative    Hospital Course:    Carl Orr was admitted for Alcohol use disorder, moderate, dependence (HCC) and crisis management.  He was treated with medications with their indications listed below.  Medical problems were identified and treated as needed.  Home medications were restarted as appropriate.  Improvement was monitored by observation and Carl Orr daily report of symptom reduction.  Emotional and mental status was monitored by daily self inventory reports completed by  Carl Orr and clinical staff.  Patient reported continued improvement, denied any new concerns.  Patient had been compliant on medications and denied side effects.  Support and encouragement was provided.    At time of discharge, patient rated both depression and anxiety levels to be manageable and minimal.  Patient encouraged to attend groups to help with recognizing triggers of emotional crises and de-stabilizations.  Patient encouraged to attend group to help identify the positive things in life that would help in dealing with feelings of loss, depression and unhealthy or abusive tendencies.         Carl Orr was evaluated by the treatment team for stability and plans for continued recovery upon discharge.  He was offered further treatment options upon discharge including Residential, Intensive Outpatient and Outpatient treatment.  He will follow up with agencies listed below for medication management and counseling.  Encouraged patient to maintain satisfactory support network and home environment.  Advised to adhere to medication compliance and outpatient treatment follow up.  Prescriptions provided.       Carl Orr motivation was an integral factor for scheduling further treatment.  Employment, transportation, bed availability, health status, family support, and any pending legal issues were also considered during his hospital stay.  Upon completion of this admission the patient was both mentally and medically stable for discharge denying suicidal/homicidal ideation, auditory/visual/tactile hallucinations, delusional thoughts and paranoia.  Physical Findings: AIMS: Facial and Oral Movements Muscles of Facial Expression: None, normal Lips and Perioral Area: None, normal Jaw: None, normal Tongue: None, normal,Extremity Movements Upper (arms, wrists, hands, fingers): None, normal Lower (legs, knees, ankles, toes): None, normal, Trunk Movements Neck, shoulders, hips: None, normal,  Overall Severity Severity of abnormal movements (highest score from questions above): None, normal Incapacitation due to abnormal movements: None, normal Patient's awareness of abnormal movements (rate only patient's report): No Awareness, Dental Status Current problems with teeth and/or dentures?: No Does patient usually wear dentures?: No  CIWA:  CIWA-Ar Total: 0 COWS:     Musculoskeletal: Strength & Muscle Tone: within normal limits Gait & Station: normal Patient leans: N/A  Psychiatric Specialty Exam: Physical Exam  Vitals reviewed. Psychiatric: He has a normal mood and affect. His behavior is normal. Thought content normal.    Review of Systems  All other systems reviewed and are negative.   Blood pressure 139/90, pulse 98, temperature 98.9 F (37.2 C), temperature source Oral, resp. rate 18, height 5\' 7"  (1.702 m), weight 49.896 kg (110 lb), SpO2 100 %.Body mass index is 17.22 kg/(m^2).    Have you used any form of tobacco in the last 30 days? (Cigarettes, Smokeless Tobacco, Cigars, and/or Pipes): Yes  Has this patient used any form of tobacco in the last 30 days? (Cigarettes, Smokeless Tobacco, Cigars, and/or Pipes) Yes, refused  Blood Alcohol level:  Lab Results  Component Value Date   ETH <5 02/26/2016    Metabolic Disorder Labs:  No results found for: HGBA1C, MPG No results found for: PROLACTIN No results found for: CHOL, TRIG, HDL, CHOLHDL, VLDL, LDLCALC  See Psychiatric Specialty Exam and Suicide Risk Assessment completed by Attending Physician prior to discharge.  Discharge destination:  Home  Is patient on multiple antipsychotic therapies at discharge:  No   Has Patient had three or more failed trials of antipsychotic monotherapy by history:  No  Recommended Plan for Multiple Antipsychotic Therapies: NA     Medication List    STOP taking these medications        diphenhydrAMINE 25 MG tablet  Commonly known as:  BENADRYL     hydrocortisone cream  1 %      TAKE these medications      Indication   hydrOXYzine 25 MG tablet  Commonly known as:  ATARAX/VISTARIL  Take 1 tablet (25 mg total) by mouth every 6 (six) hours as needed for anxiety.   Indication:  Anxiety Neurosis       Follow-up Information    Follow up with [facility].   Why:  If interested in outpatient services, please walk in between 8am-9am Monday through Friday for hospital follow-up/medication management/assessment for counseling services. Thank you.    Contact information:   [address] [place], [place] [address] Phone: [phone] Fax: [phone]      Follow-up recommendations:  Activity:  as tol Diet:  as tol  Comments:  1.  Take all your medications as prescribed.   2.  Report any adverse side effects to outpatient provider. 3.  Patient instructed to not use alcohol or illegal drugs while on prescription medicines. 4.  In the event of worsening symptoms, instructed patient to call 911, the crisis hotline or go to nearest emergency room for evaluation of symptoms.  Signed: Lindwood QuaSheila May Agustin, NP [facility] 03/01/2016, 10:27 AM  Patient seen, Suicide Assessment Completed.  Disposition Plan Reviewed

## 2016-03-01 NOTE — Progress Notes (Signed)
Patient ID: Carl BlinksKevin Waldridge, male   DOB: Jun 24, 1986, 30 y.o.   MRN: [id number]  Pt. Denies SI/HI and A/V hallucinations. Belongings returned to patient at time of discharge. Patient denies any pain or discomfort. Discharge instructions and medications were reviewed with patient. Patient verbalized understanding of both medications and discharge instructions. Q15 minute safety checks maintained until discharge. No distress upon discharge. Interpreter was there at time of discharge to ensure patient understood discharge instructions.

## 2016-03-01 NOTE — [facility] Suicide Risk Assessment (Signed)
[facility]   Principal Problem: Alcohol use disorder, moderate, dependence ([facility]) Discharge Diagnoses:  Patient Active Problem List   Diagnosis Date Noted  . Alcohol use disorder, moderate, dependence ([facility]) [F10.20] 02/27/2016    Total Time spent with patient: 30 minutes  Musculoskeletal: Strength & Muscle Tone: within normal limits Gait & Station: normal Patient leans: N/A  Psychiatric Specialty Exam: ROS denies any headache, no chest pain, no shortness of breath, describes some epigastric discomfort at times, denies vomiting, denies melenas or hematemesis  Blood pressure 139/90, pulse 98, temperature 98.9 F (37.2 C), temperature source Oral, resp. rate 18, height 5\' 7"  (1.702 m), weight 110 lb (49.896 kg), SpO2 100 %.Body mass index is 17.22 kg/(m^2).  General Appearance: improved grooming   Eye Contact::  Good  Speech:  Normal Rate  Volume:  NA- communicates via sign language with interpreter  Mood:  improved, and at this time denies feeling depressed   Affect:  Appropriate and more reactive   Thought Process:  Linear  Orientation:  Full (Time, Place, and Person)  Thought Content:  denies hallucinations, no delusions   Suicidal Thoughts:  No- denies suicidal ideations, denies any self injurious ideations   Homicidal Thoughts:  No denies any homicidal or violent ideations   Memory:  recent and remote grossly intact   Judgement:  Other:  improved   Insight:  improved   Psychomotor Activity:  Normal  Concentration:  Good  Recall:  Good  Fund of Knowledge:Good  Language: Good  Akathisia:  Negative  Handed:  Right  AIMS (if indicated):     Assets:  Desire for Improvement Resilience  Sleep:  Number of Hours: 6.5  Cognition: WNL  ADL's:  Intact   Mental Status Per Nursing Assessment::   On Admission:  NA  Demographic Factors:  30 year old male, employed, lives with sister   Loss Factors: Limited sober support system other than sister    Historical Factors: History of depression, history of alcohol dependence   Risk Reduction Factors:   Sense of responsibility to family, Employed, Living with another person, especially a relative and Positive coping skills or problem solving skills  Continued Clinical Symptoms:  At this time patient is alert, attentive, well related, describes mood as OK", denies depression and affect presents reactive, brighter, no thought disorder, no suicidal ideations, no homicidal ideations , no psychotic symptoms, future oriented, looking forward to returning home- lives with sister- and return to work next week. Denies any lingering alcohol WDL symptoms, and presents calm- no tremors, no diaphoresis, no restlessness . Denies cravings  Denies medication side effects  Cognitive Features That Contribute To Risk:  No gross cognitive deficits noted upon discharge. Is alert , attentive, and oriented x 3   Suicide Risk:  Mild:  Suicidal ideation of limited frequency, intensity, duration, and specificity.  There are no identifiable plans, no associated intent, mild dysphoria and related symptoms, good self-control (both objective and subjective assessment), few other risk factors, and identifiable protective factors, including available and accessible social support.  Follow-up Information    Follow up with [facility].   Why:  If interested in outpatient services, please walk in between 8am-9am Monday through Friday for hospital follow-up/medication management/assessment for counseling services. Thank you.    Contact information:   [address] [place], [place] [address] Phone: [phone] Fax: [phone]      Plan Of Care/Follow-up recommendations:  Activity:  as tolerated  Diet:  Regular Tests:  NA Other:  See below   Patient is leaving unit in good spirits  Plans to return to live with sister  Motivated in abstinence from alcohol at this time , encouraged to avoid people, places and  situations he associates with drinking in order to minimize cravings or triggers  COBOS, Madaline GuthrieFERNANDO, MD 03/01/2016, 10:58 AM

## 2016-03-01 NOTE — Progress Notes (Signed)
D Pt. Denies SI and HI, no complaints of pain or discomfort noted at present time.  A Writer offered support and encouragement, discussed pt.'s day .  R Pt. Rated his day a 10, denies any anxiety, depression or anger.  Pt. States the food here is good and he is eating good.  Pt. Remains safe on the unit.

## 2016-03-01 NOTE — Tx Team (Signed)
Interdisciplinary Treatment Plan Update (Adult)  Date:  03/01/2016  Time Reviewed:  9:54 AM   Progress in Treatment: Attending groups: Yes. Participating in groups:  Yes.Minimally, when he attends. Taking medication as prescribed:  Yes. Tolerating medication:  Yes. Family/Significant othe contact made:  SPE completed with pt's sister with whom he lives.  Patient understands diagnosis:  Yes. and As evidenced by:  seeking treatment for alcohol abuse, SI, depression, and for medication stabilization. Discussing patient identified problems/goals with staff:  Yes. Medical problems stabilized or resolved:  Yes. Denies suicidal/homicidal ideation: Yes. Issues/concerns per patient self-inventory:  Other:  Discharge Plan or Barriers: Pt reports no interest in aftercare but was agreeable to signing [facility] release. Pt also provided with [facility] Deaf and hard of hearing resources pamphlet, and Mental health association information. Pt given AA/NA pamphlets for [employer].   Reason for Continuation of Hospitalization: none  Comments:  Carl Orr is an 30 y.o. male presenting to [place] reporting suicidal ideation with a plan to overdose. Pt reported that he attempted suicide by cutting 7 or 8 years ago. PT did not report any self-injurious behaviors or a family history of suicide. PT did not report any current mental health treatment but shared that he was hospitalized after his suicide attempt. Pt denies HI and AVH at this time. Pt reported that his alcohol use is a stressor and is endorsing multiple depressive symptoms. Pt reported that he has experienced some weight loss and believes it was approximately 20lbs. Pt reported daily alcohol and marijuana use and shared that he uses cocaine "every now and then". Pt did not report any physical, sexual or emotional abuse at this time.  Diagnosis: Major Depressive Disorder, Recurrent episode, without psychosis; Cannabis Use Disorder, Alcohol Use Disorder    Estimated length of stay:  D/c today   Additional Comments:  Patient and CSW reviewed pt's identified goals and treatment plan. Patient verbalized understanding and agreed to treatment plan. CSW reviewed [facility] "Discharge Process and Patient Involvement" Form. Pt verbalized understanding of information provided and signed form.    Review of initial/current patient goals per problem list:  1. Goal(s): Patient will participate in aftercare plan  Met: Yes.   Target date: at discharge  As evidenced by: Patient will participate within aftercare plan AEB aftercare provider and housing plan at discharge being identified.  7/5: CSW assessing for appropriate referrals.   7/7: Pt plans to return home with his sister and return to work. Pt signed [facility] release/minimal interest.   2. Goal (s): Patient will exhibit decreased depressive symptoms and suicidal ideations.  Met:Yes   Target date: at discharge  As evidenced by: Patient will utilize self rating of depression at 3 or below and demonstrate decreased signs of depression or be deemed stable for discharge by MD.  7/5: Pt rates depression as high. Denies SI/HI/AVH.   7/7: Pt rates depression 0/10 and presents with pleasant mood/calm affect.   3. Goal(s): Patient will demonstrate decreased signs of withdrawal due to substance abuse  Met:Yes.   Target date:at discharge   As evidenced by: Patient will produce a CIWA/COWS score of 0, have stable vitals signs, and no symptoms of withdrawal.  7/5: Pt reports moderate withdrawals with CIWA score of 6 and low BP/pusle (sitting).   7/7: Pt reports no signs of withdrawal with CIWA score of 0/stable vitals.   Attendees: Patient:   03/01/2016 9:54 AM   Family:   03/01/2016 9:54 AM   Physician: Dr. Parke Poisson MD ; Dr.  Eappen MD 03/01/2016 9:54 AM   Nursing:   Chong Sicilian; Christa RN 03/01/2016 9:54 AM   Clinical Social Worker: [place], LCSW 03/01/2016 9:54 AM   Clinical Social Worker: Erasmo Downer  Drinkard LCSW 03/01/2016 9:54 AM   Other:  Agustina Caroli NP 03/01/2016 9:54 AM   Other: 03/01/2016 9:54 AM   Other:   03/01/2016 9:54 AM   Other:  03/01/2016 9:54 AM   Other:  03/01/2016 9:54 AM   Other:  03/01/2016 9:54 AM    03/01/2016 9:54 AM    03/01/2016 9:54 AM    03/01/2016 9:54 AM    03/01/2016 9:54 AM    Scribe for Treatment Team:   Maxie Better, LCSW 03/01/2016 9:54 AM

## 2016-03-01 NOTE — Progress Notes (Signed)
  [facility] :  Will you be returning to the same living situation after discharge:  Yes,  home with sister At discharge, do you have transportation home?: Yes,  sister coming at 11:00AM Do you have the ability to pay for your medications: Yes,  Medicare  Release of information consent forms completed and submitted to medical records by CSW.  Patient to Follow up at: Follow-up Information    Follow up with [facility].   Why:  If interested in outpatient services, please walk in between 8am-9am Monday through Friday for hospital follow-up/medication management/assessment for counseling services. Thank you.    Contact information:   [address] [place], [place] [address] Phone: [phone] Fax: [phone]      Next level of care provider has access to [facility] Link:no  Safety Planning and Suicide Prevention discussed: Yes,  SPE completed with pt's sister. SPI pamphlet and Mobile Crisis information provided to pt and he was encouraged to share information with his support network.   Have you used any form of tobacco in the last 30 days? (Cigarettes, Smokeless Tobacco, Cigars, and/or Pipes): Yes  Has patient been referred to the Quitline?: Patient refused referral  Patient has been referred for addiction treatment: Yes  Smart, Meilyn Heindl LCSW 03/01/2016, 9:52 AM

## 2017-04-26 ENCOUNTER — Emergency Department (HOSPITAL_COMMUNITY)
Admission: EM | Admit: 2017-04-26 | Discharge: 2017-04-26 | Disposition: A | Discharge status: Home / Self Care | Payer: Medicare Other | Attending: Emergency Medicine | Admitting: Emergency Medicine

## 2017-04-26 ENCOUNTER — Encounter (HOSPITAL_COMMUNITY): Payer: Self-pay | Admitting: *Deleted

## 2017-04-26 DIAGNOSIS — L02212 Cutaneous abscess of back [any part, except buttock]: Secondary | ICD-10-CM | POA: Insufficient documentation

## 2017-04-26 DIAGNOSIS — R222 Localized swelling, mass and lump, trunk: Secondary | ICD-10-CM | POA: Diagnosis present

## 2017-04-26 DIAGNOSIS — L0291 Cutaneous abscess, unspecified: Secondary | ICD-10-CM

## 2017-04-26 DIAGNOSIS — F17210 Nicotine dependence, cigarettes, uncomplicated: Secondary | ICD-10-CM | POA: Insufficient documentation

## 2017-04-26 MED ORDER — LIDOCAINE-EPINEPHRINE (PF) 2 %-1:200000 IJ SOLN
10.0000 mL | Freq: Once | INTRAMUSCULAR | Status: AC
  Administered 2017-04-26: 10 mL
  Filled 2017-04-26: qty 20

## 2017-04-26 MED ORDER — CEPHALEXIN 500 MG PO CAPS: 500.0000 mg | ORAL_CAPSULE | Freq: Two times a day (BID) | ORAL | 0 refills | Status: DC

## 2017-04-26 NOTE — ED Triage Notes (Signed)
Pt is hard of hearing. Has an abscess on left upper back that is causing pain. No acute distress is noted at triage.

## 2017-04-26 NOTE — Discharge Instructions (Signed)
Follow attached instructions. Please take all of your antibiotics until finished!   You may develop abdominal discomfort or diarrhea from the antibiotic.  You may help offset this with probiotics which you can buy or get in yogurt. Do not eat or take the probiotics until 2 hours after your antibiotic.Return in 2-3 days for wound recheck if increasing redness, swelling, develop fever or worsening symptoms.

## 2017-04-26 NOTE — ED Provider Notes (Signed)
[facility] Provider Note   CSN: [id number] Arrival date & time: 04/26/17  1834     History   Chief Complaint Chief Complaint  Patient presents with  . Abscess    HPI Carl BlinksKevin Orr is a 31 y.o. male who presents to department today for abscess on upper left back times 4-5 days. Patient has a history of being deaf which limits some of the history taking. Patient notes that the area has grown over the last 4-5 days with associated pain. He notes no drainage from the area. He has been taking ibuprofen and applying warm compresses the area with mild relief. He is unsure how this started. He denies IV drug use. He denies fever, chills, nausea, vomiting.   HPI  Past Medical History:  Diagnosis Date  . Deaf     Patient Active Problem List   Diagnosis Date Noted  . Alcohol use disorder, moderate, dependence (HCC) 02/27/2016    Past Surgical History:  Procedure Laterality Date  . ABDOMINAL SURGERY     at birth  . KNEE SURGERY    . TESTICLE TORSION REDUCTION         Home Medications    Prior to Admission medications   Medication Sig Start Date End Date Taking? Authorizing Provider  cephALEXin (KEFLEX) 500 MG capsule Take 1 capsule (500 mg total) by mouth 2 (two) times daily. 04/26/17   Maczis, Elmer SowMichael M, PA-C  hydrOXYzine (ATARAX/VISTARIL) 25 MG tablet Take 1 tablet (25 mg total) by mouth every 6 (six) hours as needed for anxiety. 03/01/16   Adonis BrookAgustin, Sheila, NP    Family History History reviewed. No pertinent family history.  Social History Social History  Substance Use Topics  . Smoking status: Current Every Day Smoker    Packs/day: 0.50    Types: Cigarettes  . Smokeless tobacco: Not on file  . Alcohol use No     Allergies   Hydrocodone   Review of Systems Review of Systems  Constitutional: Negative for chills and fever.  Respiratory: Negative for shortness of breath.   Cardiovascular: Negative for chest pain.  Musculoskeletal: Negative for arthralgias  and myalgias.  Skin: Positive for color change.  Neurological: Negative for numbness.     Physical Exam Updated Vital Signs BP 125/80 (BP Location: Right Arm)   Pulse 92   Temp 99.1 F (37.3 C) (Oral)   Resp 16   SpO2 100%   Physical Exam  Constitutional: He appears well-developed and well-nourished.  HENT:  Head: Normocephalic and atraumatic.  Right Ear: External ear normal.  Left Ear: External ear normal.  Eyes: Conjunctivae are normal. Right eye exhibits no discharge. Left eye exhibits no discharge. No scleral icterus.  Pulmonary/Chest: Effort normal. No respiratory distress.  Neurological: He is alert.  Skin: Skin is warm, dry and intact. Capillary refill takes less than 2 seconds. No pallor.  3 cm 3 cm area of swelling and induration with center area of fluctance on the upper left back. Mild erythema and heat   Psychiatric: He has a normal mood and affect.  Nursing note and vitals reviewed.    ED Treatments / Results  Labs (all labs ordered are listed, but only abnormal results are displayed) Labs Reviewed - No data to display  EKG  EKG Interpretation None       Radiology No results found.  Procedures .Marland Kitchen.Incision and Drainage Date/Time: 04/26/2017 7:30 PM Performed by: Jacinto HalimMACZIS, MICHAEL M Authorized by: Jacinto HalimMACZIS, MICHAEL M   Consent:    Consent obtained:  Verbal   Consent given by:  Patient   Risks discussed:  Bleeding, damage to other organs, infection, incomplete drainage and pain   Alternatives discussed:  No treatment Location:    Type:  Abscess   Size:  3cm   Location:  Trunk   Trunk location:  Back Pre-procedure details:    Skin preparation:  Chloraprep Anesthesia (see MAR for exact dosages):    Anesthesia method:  Local infiltration   Local anesthetic:  Lidocaine 2% WITH epi Procedure details:    Incision types:  Single straight   Incision depth:  Dermal   Scalpel blade:  11   Wound management:  Probed and deloculated and irrigated with  saline   Drainage:  Bloody and purulent   Drainage amount:  Scant   Wound treatment:  Wound left open Post-procedure details:    Patient tolerance of procedure:  Tolerated well, no immediate complications   (including critical care time)  Medications Ordered in ED Medications  lidocaine-EPINEPHrine (XYLOCAINE W/EPI) 2 %-1:200000 (PF) injection 10 mL (10 mLs Infiltration Given 04/26/17 1900)     Initial Impression / Assessment and Plan / ED Course  I have reviewed the triage vital signs and the nursing notes.  Pertinent labs & imaging results that were available during my care of the patient were reviewed by me and considered in my medical decision making (see chart for details).     Patient with skin abscess amenable to incision and drainage.  Abscess was not large enough to warrant packing or drain,  wound recheck in 2 days. Encouraged home warm soaks and flushing.  Mild signs of cellulitis is surrounding skin. Will prescribe abx. Will d/c to home.     Final Clinical Impressions(s) / ED Diagnoses   Final diagnoses:  Abscess    New Prescriptions New Prescriptions   CEPHALEXIN (KEFLEX) 500 MG CAPSULE    Take 1 capsule (500 mg total) by mouth 2 (two) times daily.     Jacinto Halim, PA-C 04/26/17 1934    Melene Plan, DO 04/26/17 2008

## 2017-05-27 ENCOUNTER — Emergency Department (HOSPITAL_COMMUNITY)
Admission: EM | Admit: 2017-05-27 | Discharge: 2017-05-27 | Disposition: A | Discharge status: Other Acute Inpatient Hospital | Payer: Medicare Other | Attending: Emergency Medicine | Admitting: Emergency Medicine

## 2017-05-27 ENCOUNTER — Emergency Department (HOSPITAL_COMMUNITY): Payer: Medicare Other

## 2017-05-27 ENCOUNTER — Inpatient Hospital Stay (HOSPITAL_COMMUNITY)
Admission: AD | Admit: 2017-05-27 | Discharge: 2017-05-30 | DRG: 897 | Disposition: A | Discharge status: Home / Self Care | Payer: Medicare Other | Source: Intra-hospital | Attending: Psychiatry | Admitting: Psychiatry

## 2017-05-27 ENCOUNTER — Encounter (HOSPITAL_COMMUNITY): Payer: Self-pay | Admitting: Emergency Medicine

## 2017-05-27 ENCOUNTER — Encounter (HOSPITAL_COMMUNITY): Payer: Self-pay

## 2017-05-27 DIAGNOSIS — R45851 Suicidal ideations: Secondary | ICD-10-CM | POA: Diagnosis present

## 2017-05-27 DIAGNOSIS — F1420 Cocaine dependence, uncomplicated: Secondary | ICD-10-CM | POA: Diagnosis present

## 2017-05-27 DIAGNOSIS — T5194XA Toxic effect of unspecified alcohol, undetermined, initial encounter: Secondary | ICD-10-CM | POA: Diagnosis present

## 2017-05-27 DIAGNOSIS — R079 Chest pain, unspecified: Secondary | ICD-10-CM | POA: Diagnosis present

## 2017-05-27 DIAGNOSIS — X58XXXA Exposure to other specified factors, initial encounter: Secondary | ICD-10-CM | POA: Diagnosis present

## 2017-05-27 DIAGNOSIS — R450 Nervousness: Secondary | ICD-10-CM | POA: Diagnosis not present

## 2017-05-27 DIAGNOSIS — Y900 Blood alcohol level of less than 20 mg/100 ml: Secondary | ICD-10-CM | POA: Diagnosis present

## 2017-05-27 DIAGNOSIS — R072 Precordial pain: Secondary | ICD-10-CM | POA: Insufficient documentation

## 2017-05-27 DIAGNOSIS — H905 Unspecified sensorineural hearing loss: Secondary | ICD-10-CM | POA: Diagnosis present

## 2017-05-27 DIAGNOSIS — G4700 Insomnia, unspecified: Secondary | ICD-10-CM | POA: Diagnosis present

## 2017-05-27 DIAGNOSIS — F1020 Alcohol dependence, uncomplicated: Secondary | ICD-10-CM | POA: Diagnosis present

## 2017-05-27 DIAGNOSIS — R4587 Impulsiveness: Secondary | ICD-10-CM | POA: Diagnosis not present

## 2017-05-27 DIAGNOSIS — F17210 Nicotine dependence, cigarettes, uncomplicated: Secondary | ICD-10-CM | POA: Diagnosis present

## 2017-05-27 DIAGNOSIS — Z8659 Personal history of other mental and behavioral disorders: Secondary | ICD-10-CM

## 2017-05-27 DIAGNOSIS — F1910 Other psychoactive substance abuse, uncomplicated: Secondary | ICD-10-CM | POA: Diagnosis not present

## 2017-05-27 DIAGNOSIS — F1410 Cocaine abuse, uncomplicated: Secondary | ICD-10-CM

## 2017-05-27 DIAGNOSIS — F329 Major depressive disorder, single episode, unspecified: Secondary | ICD-10-CM | POA: Diagnosis present

## 2017-05-27 DIAGNOSIS — T405X4A Poisoning by cocaine, undetermined, initial encounter: Secondary | ICD-10-CM | POA: Diagnosis present

## 2017-05-27 DIAGNOSIS — F419 Anxiety disorder, unspecified: Secondary | ICD-10-CM | POA: Diagnosis present

## 2017-05-27 HISTORY — DX: Alcohol abuse, uncomplicated: F10.10

## 2017-05-27 HISTORY — DX: Other psychoactive substance abuse, uncomplicated: F19.10

## 2017-05-27 HISTORY — DX: Unspecified hearing loss, unspecified ear: H91.90

## 2017-05-27 LAB — CBC WITH DIFFERENTIAL/PLATELET
Basophils Absolute: 0 10*3/uL (ref 0.0–0.1)
Basophils Relative: 0 %
EOS PCT: 0 %
Eosinophils Absolute: 0 10*3/uL (ref 0.0–0.7)
HEMATOCRIT: 38.8 % — AB (ref 39.0–52.0)
Hemoglobin: 12.8 g/dL — ABNORMAL LOW (ref 13.0–17.0)
LYMPHS PCT: 15 %
Lymphs Abs: 1.8 10*3/uL (ref 0.7–4.0)
MCH: 29.9 pg (ref 26.0–34.0)
MCHC: 33 g/dL (ref 30.0–36.0)
MCV: 90.7 fL (ref 78.0–100.0)
MONO ABS: 0.8 10*3/uL (ref 0.1–1.0)
MONOS PCT: 7 %
NEUTROS ABS: 9.3 10*3/uL — AB (ref 1.7–7.7)
Neutrophils Relative %: 78 %
PLATELETS: 166 10*3/uL (ref 150–400)
RBC: 4.28 MIL/uL (ref 4.22–5.81)
RDW: 14.7 % (ref 11.5–15.5)
WBC: 11.9 10*3/uL — ABNORMAL HIGH (ref 4.0–10.5)

## 2017-05-27 LAB — RAPID URINE DRUG SCREEN, HOSP PERFORMED
Amphetamines: NOT DETECTED
BARBITURATES: NOT DETECTED
BENZODIAZEPINES: NOT DETECTED
Cocaine: POSITIVE — AB
Opiates: NOT DETECTED
Tetrahydrocannabinol: POSITIVE — AB

## 2017-05-27 LAB — BASIC METABOLIC PANEL
Anion gap: 7 (ref 5–15)
BUN: 13 mg/dL (ref 6–20)
CALCIUM: 9.4 mg/dL (ref 8.9–10.3)
CO2: 26 mmol/L (ref 22–32)
CREATININE: 1.17 mg/dL (ref 0.61–1.24)
Chloride: 108 mmol/L (ref 101–111)
GFR calc Af Amer: >60 mL/min (ref >60)
GLUCOSE: 108 mg/dL — AB (ref 65–99)
Potassium: 3.7 mmol/L (ref 3.5–5.1)
Sodium: 141 mmol/L (ref 135–145)

## 2017-05-27 LAB — I-STAT TROPONIN, ED
TROPONIN I, POC: 0.11 ng/mL — AB (ref 0.00–0.08)
Troponin i, poc: 0.07 ng/mL (ref 0.00–0.08)

## 2017-05-27 LAB — SALICYLATE LEVEL

## 2017-05-27 LAB — ETHANOL

## 2017-05-27 LAB — TROPONIN I: Troponin I: 0.07 ng/mL (ref <0.03)

## 2017-05-27 LAB — ACETAMINOPHEN LEVEL

## 2017-05-27 MED ORDER — LORAZEPAM 1 MG PO TABS: 0.0000 mg | ORAL_TABLET | Freq: Four times a day (QID) | ORAL | Status: DC

## 2017-05-27 MED ORDER — ASPIRIN 81 MG PO CHEW
324.0000 mg | CHEWABLE_TABLET | Freq: Once | ORAL | Status: AC
  Administered 2017-05-27: 324 mg via ORAL
  Filled 2017-05-27: qty 4

## 2017-05-27 MED ORDER — ACETAMINOPHEN 325 MG PO TABS: 650.0000 mg | ORAL_TABLET | ORAL | Status: DC | PRN

## 2017-05-27 MED ORDER — LORAZEPAM 2 MG/ML IJ SOLN: 0.0000 mg | Freq: Two times a day (BID) | INTRAMUSCULAR | Status: DC

## 2017-05-27 MED ORDER — LORAZEPAM 1 MG PO TABS: 0.0000 mg | ORAL_TABLET | Freq: Two times a day (BID) | ORAL | Status: DC

## 2017-05-27 MED ORDER — LORAZEPAM 2 MG/ML IJ SOLN: 0.0000 mg | Freq: Four times a day (QID) | INTRAMUSCULAR | Status: DC

## 2017-05-27 MED ORDER — VITAMIN B-1 100 MG PO TABS
100.0000 mg | ORAL_TABLET | Freq: Every day | ORAL | Status: DC
  Administered 2017-05-27: 100 mg via ORAL
  Filled 2017-05-27: qty 1

## 2017-05-27 MED ORDER — MAGNESIUM HYDROXIDE 400 MG/5ML PO SUSP: 30.0000 mL | Freq: Every day | ORAL | Status: DC | PRN

## 2017-05-27 MED ORDER — NICOTINE 21 MG/24HR TD PT24
21.0000 mg | MEDICATED_PATCH | Freq: Every day | TRANSDERMAL | Status: DC
  Filled 2017-05-27 (×4): qty 1

## 2017-05-27 MED ORDER — THIAMINE HCL 100 MG/ML IJ SOLN: 100.0000 mg | Freq: Every day | INTRAMUSCULAR | Status: DC

## 2017-05-27 MED ORDER — TRAZODONE HCL 50 MG PO TABS: 50.0000 mg | ORAL_TABLET | Freq: Every evening | ORAL | Status: DC | PRN

## 2017-05-27 MED ORDER — ALUM & MAG HYDROXIDE-SIMETH 200-200-20 MG/5ML PO SUSP: 30.0000 mL | ORAL | Status: DC | PRN

## 2017-05-27 MED ORDER — LORAZEPAM 1 MG PO TABS
1.0000 mg | ORAL_TABLET | Freq: Once | ORAL | Status: AC
  Administered 2017-05-27: 1 mg via ORAL
  Filled 2017-05-27: qty 1

## 2017-05-27 MED ORDER — ONDANSETRON HCL 4 MG PO TABS: 4.0000 mg | ORAL_TABLET | Freq: Three times a day (TID) | ORAL | Status: DC | PRN

## 2017-05-27 MED ORDER — NITROGLYCERIN 2 % TD OINT
1.0000 [in_us] | TOPICAL_OINTMENT | Freq: Once | TRANSDERMAL | Status: DC
  Filled 2017-05-27: qty 1

## 2017-05-27 MED ORDER — HYDROXYZINE HCL 25 MG PO TABS
25.0000 mg | ORAL_TABLET | Freq: Three times a day (TID) | ORAL | Status: DC | PRN
  Administered 2017-05-28: 25 mg via ORAL
  Filled 2017-05-27: qty 1

## 2017-05-27 MED ORDER — ACETAMINOPHEN 325 MG PO TABS
650.0000 mg | ORAL_TABLET | Freq: Three times a day (TID) | ORAL | Status: DC | PRN
  Administered 2017-05-29: 650 mg via ORAL
  Filled 2017-05-27: qty 2

## 2017-05-27 NOTE — ED Notes (Addendum)
Interpretor has arrived - TTS aware. RN spoke w/pt via Interpretor - pt states "so so" when asked if SI. States in [place] via Interpretor was inpt last year and feels needs same again. States lives w/others and is able to return home after tx. States he has been drinking heavily and "doing all kinds of drugs". Pt will not specify when asked how much he drinks - states "too much" and will not specify which drugs. Just restates - "all kinds except heroin". States has not been taking meds as prescribed for a while. Denies hx seizures from ETOH withdrawals. States was in a store and "fell out" so was brought to ED via EMS. States fell out d/t too much drinking. States last ETOH was last night. Pt noted to be calm, cooperative, alert.

## 2017-05-27 NOTE — ED Notes (Signed)
TTS aware Interpretor should be arriving at 1500.

## 2017-05-27 NOTE — ED Notes (Signed)
Pt changed into scrubs and wanded by security  

## 2017-05-27 NOTE — ED Provider Notes (Signed)
10:14 AM Trop #2 declining.  Patient is medically clear.   Gerhard Munch, MD 05/27/17 [phone]

## 2017-05-27 NOTE — Progress Notes (Signed)
Pt accepted at [facility] Bed 304-1.  Ferne Reus, NP, is the accepting provider, Nehemiah Massed, MD, is the attending provider.  Call report to [phone].  Pt is voluntary and may be transported by [facility] for admission immediately.  [facility] ED Nurse, Olin Pia., RN notified.  Matthew Reeves. Kaylyn Lim, MSW, LCSWA Disposition Clinical Social Work [phone] (cell) [phone] (office)

## 2017-05-27 NOTE — Progress Notes (Signed)
Matthew Reeves is a 31 year old male being admitted voluntarily from [facility].  He came to ED for suicidal ideation with plan to OD on cocaine/alcohol and chest pain.  He was medically cleared.  He reported using 3-40oz beers daily and gram of cocaine daily.  He reported long history of substance abuse and depression.  He has history of treatment at the [facility] in the past but none current.  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  He denies any pain and appears to be in no physical distress.  Video interpreting utilized during admission.  He denies current SI and will contract for safety on the unit.  He denies HI or A/V hallucinations.  Oriented him to the unit.  Admission paperwork completed and signed.  Belongings searched and secured in locker # 41.  Skin assessment completed and no skin issues noted.  Q 15 minute checks initiated for safety.  We will monitor the progress towards his goals.
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
## 2017-05-27 NOTE — Tx Team (Signed)
Initial Treatment Plan 05/27/2017 10:01 PM Matthew Reeves:[id number]    PATIENT STRESSORS: Financial difficulties Substance abuse   PATIENT STRENGTHS: General fund of knowledge Motivation for treatment/growth Physical Health Supportive family/friends   PATIENT IDENTIFIED PROBLEMS: Depression  Substance abuse  Suicidal ideation   "Learn how to live"  "Quit using drugs"             DISCHARGE CRITERIA:  Medical problems require only outpatient monitoring Verbal commitment to aftercare and medication compliance Withdrawal symptoms are absent or subacute and managed without 24-hour nursing intervention  PRELIMINARY DISCHARGE PLAN: Outpatient therapy Medication management  PATIENT/FAMILY INVOLVEMENT: This treatment plan has been presented to and reviewed with the patient, Matthew Reeves.  The patient and family have been given the opportunity to ask questions and make suggestions.  Levin Bacon, RN 05/27/2017, 10:01 PM

## 2017-05-27 NOTE — ED Triage Notes (Signed)
Patient was picked up by EMS at a gas station in town for shortness of breath.  Patient also complaining of chest pain, also stated to EMS personnel that he "wanted to kill himself", did not elaborate if he had a plan and refused to have monitor place on him.  Patient is HOH and uses ASL.

## 2017-05-27 NOTE — ED Notes (Signed)
Morrie Sheldon from [facility] contacted this RN about TTS, advised that we are awaiting ASL interpretor, becky RN given Ashley's number to contact her when interpretor arrives so that TTS can be done

## 2017-05-27 NOTE — ED Notes (Signed)
Staffing made aware of need for suicide sitter

## 2017-05-27 NOTE — ED Notes (Signed)
Patient's belongings placed in locker 8 by autumn, RN

## 2017-05-27 NOTE — ED Notes (Signed)
Pt aware and voiced understanding of tx plan - [facility] will be seeking inpt. TV set to closed caption as pt requested. Pt denies any questions for RN at this time - Interpretor leaving.

## 2017-05-27 NOTE — ED Notes (Signed)
Nitro held d/t BP

## 2017-05-27 NOTE — ED Notes (Signed)
[facility] called back,  Made aware that patient is in the process of moving to a room, requested counselor give [place] a few mins to get pt situated for assessment, also made aware pt is HOH and utilize ASL and lip reading to communicate

## 2017-05-27 NOTE — ED Notes (Signed)
Pt arrived to F8 via stretcher. Pt alert, calm, cooperative. Pt ambulatory to bed. IV noted - intact. Per report, Secretary to call Interpretor so may have TTS performed.

## 2017-05-27 NOTE — ED Notes (Signed)
TTS contacted this RN to evaluate patient, advised this RN was attempting to get patient into a room to be evaluated

## 2017-05-27 NOTE — ED Provider Notes (Signed)
18: 30-the patient has been accepted[facility].  Currently patient is eating supper, is cooperative, and appears comfortable.  He is confused.   Mancel Bale, MD 05/27/17 [phone]

## 2017-05-27 NOTE — ED Notes (Signed)
[facility] contacted for transportation after 1930.

## 2017-05-27 NOTE — ED Notes (Signed)
Pt to xray via stretcher on CCM 

## 2017-05-27 NOTE — ED Notes (Signed)
TTS being performed being assisted by Interpretor.

## 2017-05-27 NOTE — ED Provider Notes (Signed)
[facility]-EMERGENCY DEPT Provider Note   CSN: [id number] Arrival date & time: 05/27/17  0442     History   Chief Complaint Chief Complaint  Patient presents with  . Chest Pain  . Suicidal    HPI Matthew Reeves is a 31 y.o. male.  HPI  This is a 31 year old male with a history of congenital deafness who presents with suicidal ideation and chest pain. Patient reports that he used crack cocaine today and "I need help." Last use 6 hours ago. Patient states he had onset of anterior chest pain. Nonradiating. Currently 10 out of 10. Denies any shortness of breath. Chest pain came after smoking crack. Additionally, patient states "I just want to kill myself."  He reports that he tried to harm himself by taking drugs. Denies any other ingestions.  Denies history of hypertension, hyperlipidemia, diabetes, strong family history of heart disease.  Sign language interpretation used for history taking.  History reviewed. No pertinent past medical history.  There are no active problems to display for this patient.   History reviewed. No pertinent surgical history.     Home Medications    Prior to Admission medications   Not on File    Family History No family history on file.  Social History Social History  Substance Use Topics  . Smoking status: Not on file  . Smokeless tobacco: Not on file  . Alcohol use Not on file     Allergies   Patient has no known allergies.   Review of Systems Review of Systems  Constitutional: Negative for fever.  Respiratory: Positive for chest tightness. Negative for shortness of breath.   Cardiovascular: Positive for chest pain. Negative for leg swelling.  Gastrointestinal: Negative for abdominal pain, nausea and vomiting.  Genitourinary: Negative for dysuria.  Psychiatric/Behavioral: Positive for suicidal ideas.  All other systems reviewed and are negative.    Physical Exam Updated Vital Signs BP (!) 106/56   Pulse (!) 51   Temp 98.6  F (37 C) (Oral)   Resp 18   SpO2 100%   Physical Exam  Constitutional: He is oriented to person, place, and time. No distress.  Disheveled, no acute distress  HENT:  Head: Normocephalic and atraumatic.  Cardiovascular: Normal rate, regular rhythm and normal heart sounds.   No murmur heard. Pulmonary/Chest: Effort normal and breath sounds normal. No respiratory distress. He has no wheezes.  Abdominal: Soft. There is no tenderness.  Musculoskeletal: He exhibits no edema.  Lymphadenopathy:    He has no cervical adenopathy.  Neurological: He is alert and oriented to person, place, and time.  Skin: Skin is warm and dry.  Psychiatric: He has a normal mood and affect.  Nursing note and vitals reviewed.    ED Treatments / Results  Labs (all labs ordered are listed, but only abnormal results are displayed) Labs Reviewed  CBC WITH DIFFERENTIAL/PLATELET - Abnormal; Notable for the following:       Result Value   WBC 11.9 (*)    Hemoglobin 12.8 (*)    HCT 38.8 (*)    Neutro Abs 9.3 (*)    All other components within normal limits  BASIC METABOLIC PANEL - Abnormal; Notable for the following:    Glucose, Bld 108 (*)    All other components within normal limits  ACETAMINOPHEN LEVEL - Abnormal; Notable for the following:    Acetaminophen (Tylenol), Serum <10 (*)    All other components within normal limits  I-STAT TROPONIN, ED - Abnormal; Notable for  the following:    Troponin i, poc 0.11 (*)    All other components within normal limits  ETHANOL  SALICYLATE LEVEL  RAPID URINE DRUG SCREEN, HOSP PERFORMED    EKG  EKG Interpretation  Date/Time:  Tuesday May 27 2017 04:56:37 EDT Ventricular Rate:  56 PR Interval:    QRS Duration: 96 QT Interval:  417 QTC Calculation: 403 R Axis:   49 Text Interpretation:  Sinus rhythm RSR' in V1 or V2, probably normal variant ST elev, probable normal early repol pattern No prior for comparison Confirmed by Ross Marcus ([id number]) on  05/27/2017 5:05:34 AM       Radiology Dg Chest 2 View  Result Date: 05/27/2017 CLINICAL DATA:  Shortness of breath and chest pain. EXAM: CHEST  2 VIEW COMPARISON:  05/27/2009 FINDINGS: The heart size and mediastinal contours are within normal limits. Both lungs are clear. The visualized skeletal structures are unremarkable. IMPRESSION: No active cardiopulmonary disease. Electronically Signed   By: Burman Nieves M.D.   On: 05/27/2017 05:48    Procedures Procedures (including critical care time)  Medications Ordered in ED Medications  nitroGLYCERIN (NITROGLYN) 2 % ointment 1 inch (not administered)  aspirin chewable tablet 324 mg (324 mg Oral Given 05/27/17 0530)  LORazepam (ATIVAN) tablet 1 mg (1 mg Oral Given 05/27/17 0530)     Initial Impression / Assessment and Plan / ED Course  I have reviewed the triage vital signs and the nursing notes.  Pertinent labs & imaging results that were available during my care of the patient were reviewed by me and considered in my medical decision making (see chart for details).     Patient seen today with chest pain in the setting of crack cocaine use. Also reports suicidal ideation. Nontoxic-appearing. Vital signs reassuring. EKG is nonischemic. Patient was given aspirin and nitroglycerin ointment. Initial troponin is 0.11. He has no other risk factors for heart disease. Suspect this may be related to recent cocaine abuse. Will repeat at 9 AM to assess for stability or declining. Ultimately, patient will need TTS consultation.  Final Clinical Impressions(s) / ED Diagnoses   Final diagnoses:  Precordial pain  Cocaine abuse (HCC)  Suicidal ideation    New Prescriptions New Prescriptions   No medications on file     Shon Baton, MD 05/27/17 [phone]

## 2017-05-27 NOTE — ED Notes (Signed)
ED secretary to call for ASL interpretor for TTS

## 2017-05-27 NOTE — ED Notes (Signed)
Dr Jeraldine Loots made aware of need for psych hold orders

## 2017-05-27 NOTE — ED Notes (Signed)
Kriste Basque, RN made aware ASL interpretor should arrive around 1500

## 2017-05-28 DIAGNOSIS — F1420 Cocaine dependence, uncomplicated: Secondary | ICD-10-CM | POA: Diagnosis present

## 2017-05-28 DIAGNOSIS — F419 Anxiety disorder, unspecified: Secondary | ICD-10-CM

## 2017-05-28 DIAGNOSIS — R4587 Impulsiveness: Secondary | ICD-10-CM

## 2017-05-28 DIAGNOSIS — G4700 Insomnia, unspecified: Secondary | ICD-10-CM

## 2017-05-28 DIAGNOSIS — F1020 Alcohol dependence, uncomplicated: Secondary | ICD-10-CM

## 2017-05-28 DIAGNOSIS — R450 Nervousness: Secondary | ICD-10-CM

## 2017-05-28 DIAGNOSIS — F17210 Nicotine dependence, cigarettes, uncomplicated: Secondary | ICD-10-CM

## 2017-05-28 DIAGNOSIS — F1910 Other psychoactive substance abuse, uncomplicated: Secondary | ICD-10-CM

## 2017-05-28 MED ORDER — ENSURE ENLIVE PO LIQD: 237.0000 mL | Freq: Two times a day (BID) | ORAL | Status: DC

## 2017-05-28 MED ORDER — LOPERAMIDE HCL 2 MG PO CAPS: 2.0000 mg | ORAL_CAPSULE | ORAL | Status: DC | PRN

## 2017-05-28 MED ORDER — ONDANSETRON 4 MG PO TBDP: 4.0000 mg | ORAL_TABLET | Freq: Four times a day (QID) | ORAL | Status: DC | PRN

## 2017-05-28 MED ORDER — ADULT MULTIVITAMIN W/MINERALS CH
1.0000 | ORAL_TABLET | Freq: Every day | ORAL | Status: DC
  Administered 2017-05-28 – 2017-05-29 (×2): 1 via ORAL
  Filled 2017-05-28 (×4): qty 1

## 2017-05-28 MED ORDER — HYDROXYZINE HCL 25 MG PO TABS
25.0000 mg | ORAL_TABLET | Freq: Four times a day (QID) | ORAL | Status: DC | PRN
Start: 2017-05-28 — End: 2017-05-30

## 2017-05-28 MED ORDER — VITAMIN B-1 100 MG PO TABS
100.0000 mg | ORAL_TABLET | Freq: Every day | ORAL | Status: DC
  Administered 2017-05-29: 100 mg via ORAL
  Filled 2017-05-28 (×3): qty 1

## 2017-05-28 MED ORDER — CHLORDIAZEPOXIDE HCL 25 MG PO CAPS: 25.0000 mg | ORAL_CAPSULE | Freq: Four times a day (QID) | ORAL | Status: DC | PRN
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
## 2017-05-28 NOTE — Plan of Care (Signed)
Problem: Education: Goal: Emotional status will improve Outcome: Progressing Nurse discussed depression/anxiety/coping skills with patient.    

## 2017-05-28 NOTE — Progress Notes (Signed)
D:  Patient's self inventory sheet, patient sleeps good, no sleep medication given.  Fair appetite, normal energy level, good concentration.  Denied depression, hopeless and anxiety.  Denied withdrawals.  Checked chilling.  Denied SI.  Denied physical problems.  Denied physical pain.  Goal is to learn to live right. A:  Medications administered per MD orders.  Emotional support and encouragement given patient. R:  Denied SI and HI, contracts for safety.  Denied A/V hallucinations.  Safety maintained with 15 minute checks.

## 2017-05-28 NOTE — Progress Notes (Addendum)
Patient ID: Matthew Reeves, male   DOB: April 25, 1986, 31 y.o.   MRN: [id number]  Pt currently presents with a pleasant affect and anxious behavior. Pt reports to writer that their goal is to "go to groups." Pt states "I think I missed talking to someone today about the [facility]." Pt reports good sleep without any current nighttime medication regimen. Pt denies any current physical discomfort than the chronic chest aching he has been experiencing since admission to the ED. Denies worsening symptoms.   Pt's labs and vitals were monitored throughout the night. Pt given a 1:1 about emotional and mental status. Pt supported and encouraged to express concerns and questions. Pt educated on sleep and anti-anxiety medications available. Assessment completed with the assistance of an interpreter.   Pt's safety ensured with 15 minute and environmental checks. Pt currently denies SI/HI and A/V hallucinations. Pt verbally agrees to seek staff if SI/HI or A/VH occurs and to consult with staff before acting on any harmful thoughts. Pt reports to Clinical research associate that he wishes he could work out while here, reports doing push ups in his room. Will continue POC.

## 2017-05-28 NOTE — Tx Team (Signed)
Interdisciplinary Treatment and Diagnostic Plan Update  05/28/2017 Time of Session: 0830AM Matthew Reeves MRN: [id number]  Principal Diagnosis: MDD recurrent, severe, without psychosis  Secondary Diagnoses: Active Problems:   MDD (major depressive disorder)   Current Medications:  Current Facility-Administered Medications  Medication Dose Route Frequency Provider Last Rate Last Dose  . acetaminophen (TYLENOL) tablet 650 mg  650 mg Oral Q8H PRN Okonkwo, Justina A, NP      . alum & mag hydroxide-simeth (MAALOX/MYLANTA) 200-200-20 MG/5ML suspension 30 mL  30 mL Oral Q4H PRN Okonkwo, Justina A, NP      . hydrOXYzine (ATARAX/VISTARIL) tablet 25 mg  25 mg Oral TID PRN Beryle Lathe, Justina A, NP   25 mg at 05/28/17 0846  . magnesium hydroxide (MILK OF MAGNESIA) suspension 30 mL  30 mL Oral Daily PRN Okonkwo, Justina A, NP      . nicotine (NICODERM CQ - dosed in mg/24 hours) patch 21 mg  21 mg Transdermal Daily Cobos, Fernando A, MD      . traZODone (DESYREL) tablet 50 mg  50 mg Oral QHS PRN Okonkwo, Justina A, NP       PTA Medications: No prescriptions prior to admission.    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   Physician Treatment Plan for Primary Diagnosis: MDD recurrent, severe, without psychosis  Medication Management: Evaluate patient's response, side effects, and tolerance of medication regimen.  Therapeutic Interventions: 1 to 1 sessions, Unit Group sessions and Medication administration.  Evaluation of Outcomes: Progressing  Physician Treatment Plan for Secondary Diagnosis: Active Problems:   MDD (major depressive disorder)  Long Term Goal(s):     Short Term Goals:       Medication  Management: Evaluate patient's response, side effects, and tolerance of medication regimen.  Therapeutic Interventions: 1 to 1 sessions, Unit Group sessions and Medication administration.  Evaluation of Outcomes: Progressing   RN Treatment Plan for Primary Diagnosis: MDD recurrent, severe, without psychosis Long Term Goal(s): Knowledge of disease and therapeutic regimen to maintain health will improve  Short Term Goals: Ability to remain free from injury will improve, Ability to demonstrate self-control and Ability to disclose and discuss suicidal ideas  Medication Management: RN will administer medications as ordered by provider, will assess and evaluate patient's response and provide education to patient for prescribed medication. RN will report any adverse and/or side effects to prescribing provider.  Therapeutic Interventions: 1 on 1 counseling sessions, Psychoeducation, Medication administration, Evaluate responses to treatment, Monitor vital signs and CBGs as ordered, Perform/monitor CIWA, COWS, AIMS and Fall Risk screenings as ordered, Perform wound care treatments as ordered.  Evaluation of Outcomes: Progressing   LCSW Treatment Plan for Primary Diagnosis: MDD recurrent, severe, without psychosis Long Term Goal(s): Safe transition to appropriate next level of care at discharge, Engage patient in therapeutic group addressing interpersonal concerns.  Short Term Goals: Engage patient in aftercare planning with referrals and resources, Increase social support, Facilitate patient progression through stages of change regarding substance use diagnoses and concerns and Identify triggers associated with mental health/substance abuse issues  Therapeutic Interventions: Assess for all discharge needs, 1 to 1 time with Social worker, Explore available resources and support systems, Assess for adequacy in community support network, Educate family and significant other(s) on suicide prevention,  Complete Psychosocial Assessment, Interpersonal group therapy.  Evaluation of Outcomes: Progressing   Progress in Treatment: Attending groups: No, new to unit. Continuing to assess.  Participating in groups: No. Taking medication as prescribed: Yes. Toleration medication: Yes. Family/Significant other contact made: No, will contact:  family member if patient consents Patient understands diagnosis: Yes. Discussing patient identified problems/goals with staff: Yes. Medical problems stabilized or resolved: Yes. Denies suicidal/homicidal ideation: Yes. Issues/concerns per patient self-inventory: No. Other: n/a   New problem(s) identified: Patient is deaf; reads lips; may need interpretor.   New Short Term/Long Term Goal(s): medication management for detox/mood stabilization, development of comprehensive mental wellness/sobriety plan, elimination of SI thoughts.   Patient Goal: "I'm really depressed and need to detox."   Discharge Plan or Barriers: CSW assessing for appropriate referrals. Pt has history at the [facility] and has been living with his sister.   Reason for Continuation of Hospitalization: Anxiety Depression Medication stabilization Suicidal ideation Withdrawal symptoms  Estimated Length of Stay: Monday 06/02/17  Attendees: Patient: 05/28/2017 9:08 AM  Physician: Dr. Jama Flavors MD 05/28/2017 9:08 AM  Nursing: Daine Gip RN 05/28/2017 9:08 AM  RN Care Manager: Onnie Boer CM 05/28/2017 9:08 AM  Social Worker: Chartered loss adjuster, LCSW 05/28/2017 9:08 AM  Recreational Therapist: x  05/28/2017 9:08 AM  Other: Armandina Stammer NP; Reola Calkins NP; Hillery Jacks NP 05/28/2017 9:08 AM  Other:  05/28/2017 9:08 AM  Other: 05/28/2017 9:08 AM    Scribe for Treatment Team: Ledell Peoples Smart, LCSW 05/28/2017 9:08 AM

## 2017-05-28 NOTE — H&P (Signed)
Psychiatric Admission Assessment Adult  Patient Identification: Matthew Reeves  MRN:  [id number]  Date of Evaluation:  05/28/2017  Chief Complaint: Suicidal ideations & chest pain.  Principal Diagnosis: Alcohol use disorder, dependence, Cocaine use disorder, dependence.  Diagnosis:   Patient Active Problem List   Diagnosis Date Noted  . Cocaine use disorder, moderate, dependence ([place]) [F14.20] 05/28/2017  . Alcohol use disorder, moderate, dependence ([place]) [F10.20] 05/28/2017  . MDD (major depressive disorder) [F32.9] 05/27/2017   History of Present Illness: This is an admission assessment for this 31 year old AA male with history of Cocaine & alcohol use disorder. He is also hearing impaired. Speaks some what clearly & able to understand spoken words. He also reads lip & able to ask for a repeat of what he could not understand. He is being admitted to the [facility] from the [facility] hospital with complaints of suicidal ideations & chest pain. His UDS was positive for Cocaine & THC. During this assessment, Symeon reports, "The ambulance took me to the [facility] last night. I was out of breath because my chest was hurting. Every time I drank alcohol or smoke crack, my chest will hurt. I use crack once a week & drank alcohol daily. I drink like 2-3 bottles of the 40 ounces of beer at one time on daily basis. I'm an alcoholic, having been drinking heavily since age 31. I have been smoking crack for the last 6 months. I use crack & drink alcohol because I'm stressed a lot. I came to this hospital to get help & learn to live the right way. I don't have any diagnosis of mental illness. I'm a little sad, but do not need medicines for it. I will prefer substance abuse treatment program instead. May be go back to the [facility] again. I was there a long time ago for substance abuse treatment. I need to be busy doing something or I will continue to drink & use because life is so  stressful".  Associated Signs/Symptoms: Depression Symptoms:  depressed mood, feelings of worthlessness/guilt, anxiety,  (Hypo) Manic Symptoms:  Impulsivity,  Anxiety Symptoms:  Excessive Worry,  Psychotic Symptoms:  Denies any hallucinations, delusional thinking or paranoia.  PTSD Symptoms: Patient denies any PTSD symptoms & or events.  Total Time spent with patient: Greater than 30 minutes  Past Psychiatric History: Polysubstance use disorder.  Is the patient at risk to self? No.  Has the patient been a risk to self in the past 6 months? Yes.    Has the patient been a risk to self within the distant past? No.  Is the patient a risk to others? No.  Has the patient been a risk to others in the past 6 months? No.  Has the patient been a risk to others within the distant past? No.   Prior Inpatient Therapy: Yes ([place] x 3 times, remotely). Prior Outpatient Therapy: Yes, ([facility]).   Alcohol Screening: 1. How often do you have a drink containing alcohol?: 4 or more times a week 2. How many drinks containing alcohol do you have on a typical day when you are drinking?: 7, 8, or 9 3. How often do you have six or more drinks on one occasion?: Daily or almost daily Preliminary Score: 7 4. How often during the last year have you found that you were not able to stop drinking once you had started?: Weekly 5. How often during the last year have you failed to do what  was normally expected from you becasue of drinking?: Daily or almost daily 6. How often during the last year have you needed a first drink in the morning to get yourself going after a heavy drinking session?: Daily or almost daily 7. How often during the last year have you had a feeling of guilt of remorse after drinking?: Daily or almost daily 8. How often during the last year have you been unable to remember what happened the night before because you had been drinking?: Daily or almost daily 9. Have you or someone else  been injured as a result of your drinking?: Yes, but not in the last year 10. Has a relative or friend or a doctor or another health worker been concerned about your drinking or suggested you cut down?: Yes, during the last year Alcohol Use Disorder Identification Test Final Score (AUDIT): 36 Brief Intervention: Yes  Substance Abuse History in the last 12 months:  Yes.    Consequences of Substance Abuse: Medical Consequences:  Liver damage, Possible death by overdose Legal Consequences:  Arrests, jail time, Loss of driving privilege. Family Consequences:  Family discord, divorce and or separation.  Previous Psychotropic Medications: Yes   Psychological Evaluations: No   Past Medical History:  Past Medical History:  Diagnosis Date  . Alcohol abuse   . Deaf    congenital - HOH  . Polysubstance abuse ([place])    History reviewed. No pertinent surgical history. Family History: History reviewed. No pertinent family history. Family Psychiatric  History:  Tobacco Screening: Have you used any form of tobacco in the last 30 days? (Cigarettes, Smokeless Tobacco, Cigars, and/or Pipes): Yes Tobacco use, Select all that apply: 5 or more cigarettes per day Are you interested in Tobacco Cessation Medications?: Yes, will notify MD for an order Counseled patient on smoking cessation including recognizing danger situations, developing coping skills and basic information about quitting provided: Refused/Declined practical counseling  Social History:  History  Alcohol use Not on file     History  Drug use: Unknown    Additional Social History:  Allergies:  No Known Allergies  Lab Results:  Results for orders placed or performed during the hospital encounter of 05/27/17 (from the past 48 hour(s))  CBC with Differential     Status: Abnormal   Collection Time: 05/27/17  5:10 AM  Result Value Ref Range   WBC 11.9 (H) 4.0 - 10.5 K/uL   RBC 4.28 4.22 - 5.81 MIL/uL   Hemoglobin 12.8 (L) 13.0 -  17.0 g/dL   HCT 38.8 (L) 39.0 - 52.0 %   MCV 90.7 78.0 - 100.0 fL   MCH 29.9 26.0 - 34.0 pg   MCHC 33.0 30.0 - 36.0 g/dL   RDW 14.7 11.5 - 15.5 %   Platelets 166 150 - 400 K/uL   Neutrophils Relative % 78 %   Neutro Abs 9.3 (H) 1.7 - 7.7 K/uL   Lymphocytes Relative 15 %   Lymphs Abs 1.8 0.7 - 4.0 K/uL   Monocytes Relative 7 %   Monocytes Absolute 0.8 0.1 - 1.0 K/uL   Eosinophils Relative 0 %   Eosinophils Absolute 0.0 0.0 - 0.7 K/uL   Basophils Relative 0 %   Basophils Absolute 0.0 0.0 - 0.1 K/uL  Basic metabolic panel     Status: Abnormal   Collection Time: 05/27/17  5:10 AM  Result Value Ref Range   Sodium 141 135 - 145 mmol/L   Potassium 3.7 3.5 - 5.1 mmol/L   Chloride  108 101 - 111 mmol/L   CO2 26 22 - 32 mmol/L   Glucose, Bld 108 (H) 65 - 99 mg/dL   BUN 13 6 - 20 mg/dL   Creatinine, Ser 1.17 0.61 - 1.24 mg/dL   Calcium 9.4 8.9 - 10.3 mg/dL   GFR calc non Af Amer >60 >60 mL/min   GFR calc Af Amer >60 >60 mL/min    Comment: (NOTE) The eGFR has been calculated using the CKD EPI equation. This calculation has not been validated in all clinical situations. eGFR's persistently <60 mL/min signify possible Chronic Kidney Disease.    Anion gap 7 5 - 15  Ethanol     Status: None   Collection Time: 05/27/17  5:11 AM  Result Value Ref Range   Alcohol, Ethyl (B) <10 <10 mg/dL    Comment:        LOWEST DETECTABLE LIMIT FOR SERUM ALCOHOL IS 10 mg/dL FOR MEDICAL PURPOSES ONLY Please note change in reference range.   Acetaminophen level     Status: Abnormal   Collection Time: 05/27/17  5:11 AM  Result Value Ref Range   Acetaminophen (Tylenol), Serum <10 (L) 10 - 30 ug/mL    Comment:        THERAPEUTIC CONCENTRATIONS VARY SIGNIFICANTLY. A RANGE OF 10-30 ug/mL MAY BE AN EFFECTIVE CONCENTRATION FOR MANY PATIENTS. HOWEVER, SOME ARE BEST TREATED AT CONCENTRATIONS OUTSIDE THIS RANGE. ACETAMINOPHEN CONCENTRATIONS >150 ug/mL AT 4 HOURS AFTER INGESTION AND >50 ug/mL AT  12 HOURS AFTER INGESTION ARE OFTEN ASSOCIATED WITH TOXIC REACTIONS.   Salicylate level     Status: None   Collection Time: 05/27/17  5:11 AM  Result Value Ref Range   Salicylate Lvl <0.5 2.8 - 30.0 mg/dL  I-Stat Troponin, ED (not at [facility])     Status: Abnormal   Collection Time: 05/27/17  5:22 AM  Result Value Ref Range   Troponin i, poc 0.11 (HH) 0.00 - 0.08 ng/mL   Comment NOTIFIED PHYSICIAN    Comment 3            Comment: Due to the release kinetics of cTnI, a negative result within the first hours of the onset of symptoms does not rule out myocardial infarction with certainty. If myocardial infarction is still suspected, repeat the test at appropriate intervals.   Rapid urine drug screen (hospital performed)     Status: Abnormal   Collection Time: 05/27/17  7:13 AM  Result Value Ref Range   Opiates NONE DETECTED NONE DETECTED   Cocaine POSITIVE (A) NONE DETECTED   Benzodiazepines NONE DETECTED NONE DETECTED   Amphetamines NONE DETECTED NONE DETECTED   Tetrahydrocannabinol POSITIVE (A) NONE DETECTED   Barbiturates NONE DETECTED NONE DETECTED    Comment:        DRUG SCREEN FOR MEDICAL PURPOSES ONLY.  IF CONFIRMATION IS NEEDED FOR ANY PURPOSE, NOTIFY LAB WITHIN 5 DAYS.        LOWEST DETECTABLE LIMITS FOR URINE DRUG SCREEN Drug Class       Cutoff (ng/mL) Amphetamine      1000 Barbiturate      200 Benzodiazepine   697 Tricyclics       948 Opiates          300 Cocaine          300 THC              50   Troponin I     Status: Abnormal   Collection Time: 05/27/17  8:54 AM  Result Value Ref Range   Troponin I 0.07 (HH) <0.03 ng/mL    Comment: CRITICAL RESULT CALLED TO, READ BACK BY AND VERIFIED WITH: C.CARLAN,RN 05/27/17 1008 BY BSLADE   I-Stat Troponin, ED (not at [facility])     Status: None   Collection Time: 05/27/17  9:06 AM  Result Value Ref Range   Troponin i, poc 0.07 0.00 - 0.08 ng/mL   Comment 3            Comment: Due to the release kinetics of cTnI, a  negative result within the first hours of the onset of symptoms does not rule out myocardial infarction with certainty. If myocardial infarction is still suspected, repeat the test at appropriate intervals.    Blood Alcohol level:  Lab Results  Component Value Date   ETH <10 05/27/2017   [facility]  10/10/2010    <5        LOWEST DETECTABLE LIMIT FOR SERUM ALCOHOL IS 5 mg/dL FOR MEDICAL PURPOSES ONLY   Metabolic Disorder Labs:  No results found for: HGBA1C, MPG No results found for: PROLACTIN No results found for: CHOL, TRIG, HDL, CHOLHDL, VLDL, LDLCALC  Current Medications: Current Facility-Administered Medications  Medication Dose Route Frequency Provider Last Rate Last Dose  . acetaminophen (TYLENOL) tablet 650 mg  650 mg Oral Q8H PRN Okonkwo, Justina A, NP      . alum & mag hydroxide-simeth (MAALOX/MYLANTA) 200-200-20 MG/5ML suspension 30 mL  30 mL Oral Q4H PRN Okonkwo, Justina A, NP      . feeding supplement (ENSURE ENLIVE) (ENSURE ENLIVE) liquid 237 mL  237 mL Oral BID BM Jaran Sainz A, MD      . hydrOXYzine (ATARAX/VISTARIL) tablet 25 mg  25 mg Oral TID PRN Lu Duffel, Justina A, NP   25 mg at 05/28/17 0846  . magnesium hydroxide (MILK OF MAGNESIA) suspension 30 mL  30 mL Oral Daily PRN Okonkwo, Justina A, NP      . nicotine (NICODERM CQ - dosed in mg/24 hours) patch 21 mg  21 mg Transdermal Daily Robertlee Rogacki A, MD      . traZODone (DESYREL) tablet 50 mg  50 mg Oral QHS PRN Okonkwo, Justina A, NP       PTA Medications: No prescriptions prior to admission.   Musculoskeletal: Strength & Muscle Tone: within normal limits Gait & Station: normal Patient leans: N/A  Psychiatric Specialty Exam: Physical Exam  Constitutional: He appears well-developed.  HENT:  Congenital deafness. Reads lips  Eyes: Pupils are equal, round, and reactive to light.  Neck: Normal range of motion.  Cardiovascular:  Low pulse rate (47).  Respiratory: Effort normal.  GI: Soft.   Genitourinary:  Genitourinary Comments: Deferred  Musculoskeletal: Normal range of motion.  Neurological: He is alert.  Skin: Skin is warm.    Review of Systems  Constitutional: Negative.   HENT: Negative.   Eyes: Negative.   Respiratory: Negative.   Cardiovascular: Negative.   Gastrointestinal: Negative.   Genitourinary: Negative.   Musculoskeletal: Negative.   Skin: Negative.   Neurological: Negative.   Endo/Heme/Allergies: Negative.   Psychiatric/Behavioral: Positive for depression, substance abuse (UDS (+) for Cocaine & THC) and suicidal ideas. Negative for hallucinations and memory loss. The patient is nervous/anxious and has insomnia.     Blood pressure 117/81, pulse (!) 47, temperature 98.5 F (36.9 C), temperature source Oral, resp. rate 18, height '5\' 6"'  (1.676 m), weight 51 kg (112 lb 8 oz), SpO2 100 %.Body mass index is 18.16 kg/m.  General  Appearance: Disheveled  Eye Contact:  Good  Speech:  Clear and Coherent and Normal Rate  Volume:  Normal  Mood:  Depressed and Hopeless  Affect:  Restricted  Thought Process:  Coherent and Goal Directed  Orientation:  Full (Time, Place, and Person)  Thought Content:  Rumination, however, denies any hallucinations, delusional thoughts or paranoia.  Suicidal Thoughts:  Currently denies any thoughts, plans or intent.  Homicidal Thoughts:  Denies any thoughts, plans or intent.  Memory:  Immediate;   Good Recent;   Good Remote;   Good  Judgement:  Fair  Insight:  Present  Psychomotor Activity:  Normal  Concentration:  Concentration: Good and Attention Span: Good  Recall:  Good  Fund of Knowledge:  Good  Language:  Fair  Akathisia:  Negative  Handed:  Right  AIMS (if indicated):     Assets:  Communication Skills Desire for Improvement Social Support  ADL's:  Intact  Cognition:  WNL  Sleep:      Treatment Plan/Recommendations: 1. Admit for crisis management and stabilization, estimated length of stay 3-5 days.   2.  Medication management to reduce current symptoms to base line and improve the patient's overall level of functioning: (See MAR, Md's SRA & treatment plan).   3. Treat health problems as indicated.  4. Develop treatment plan to decrease risk of relapse upon discharge and the need for readmission.  5. Psycho-social education regarding relapse prevention and self care.  6. Health care follow up as needed for medical problems.  7. Review, reconcile, and reinstate any pertinent home medications for other health issues where appropriate. 8. Call for consults with hospitalist for any additional specialty patient care services as needed.  Observation Level/Precautions:  15 minute checks  Laboratory:  Per ED, UDS (+) for Cocaine & THC.  Psychotherapy: Group sessions  Medications: See MAR.    Consultations: As needed.   Discharge Concerns: Safety, mood stability, maintaining sobriety.  Estimated LOS: 2-4 days  Other: Admit to the 300-hall.   Physician Treatment Plan for Primary Diagnosis: Will initiate medication management for mood stability. Set up an outpatient psychiatric services for medication management. Will encourage medication adherence with psychiatric medications.  Long Term Goal(s): Improvement in symptoms so as ready for discharge  Short Term Goals: Ability to identify changes in lifestyle to reduce recurrence of condition will improve and Ability to disclose and discuss suicidal ideas  Physician Treatment Plan for Secondary Diagnosis: Principal Problem:   Alcohol use disorder, moderate, dependence ([place]) Active Problems:   MDD (major depressive disorder)   Cocaine use disorder, moderate, dependence ([place])  Long Term Goal(s): Improvement in symptoms so as ready for discharge  Short Term Goals: Ability to identify and develop effective coping behaviors will improve, Compliance with prescribed medications will improve and Ability to identify triggers associated with substance  abuse/mental health issues will improve  I certify that inpatient services furnished can reasonably be expected to improve the patient's condition.    Encarnacion Slates, NP, PMHNP, FNP-BC. 10/3/201811:47 AM   I have reviewed case with NP and have met with patient Agree with NP assessment  31 year old male . Lives with a sister . On disability, states that sister is his designated payee.  Presented to the ED due to chest pain, suicidal ideations. Reported chest pain had occurred following crack cocaine abuse He also reports he was drinking two 40 ounce beers daily.  Although presents with a vaguely constricted affect, reports he is feeling much better, and  currently denies any significant neuro-vegetative symptoms, also denies psychotic symptoms. Denies any suicidal ideations at this time.   Reports history of depression, suicide attempts in the past. Endorses history of cocaine abuse, alcohol abuse. No history of withdrawal seizures or DTs. In the past had been attending [facility] for outpatient treatment  Was not taking psychiatric medications prior to admission  Denies medical history- hearing impaired  Dx- Cocaine Use Disorder, Alcohol Use Disorder, Substance Induced Mood Disorder, Depressed   Plan- Inpatient admission. Monitor mood and affect , consider antidepressant medication if mood remains depressed in spite of sobriety, abstinence . He is currently not presenting with alcohol WDL symptoms, will start Librium PRNs to minimize potential risk of WDL. Patient is expressing interest in rehab after discharge

## 2017-05-28 NOTE — [facility] Suicide Risk Assessment (Signed)
[facility] INPATIENT:  Family/Significant Other Suicide Prevention Education  Suicide Prevention Education:  Patient Refusal for Family/Significant Other Suicide Prevention Education: The patient Matthew Reeves has refused to provide written consent for family/significant other to be provided Family/Significant Other Suicide Prevention Education during admission and/or prior to discharge.  Physician notified.  SPE completed with pt, as pt refused to consent to family contact. SPI pamphlet provided to pt and pt was encouraged to share information with support network, ask questions, and talk about any concerns relating to SPE. Pt denies access to guns/firearms and verbalized understanding of information provided. Mobile Crisis information also provided to pt.   Arihanna Estabrook N Smart LCSW 05/28/2017, 10:38 AM

## 2017-05-28 NOTE — [facility] Group Notes (Signed)
[facility] Mental Health Association Group Therapy 05/28/2017 1:15pm  Type of Therapy: Mental Health Association Presentation  Participation Level: Active  Participation Quality: Attentive  Affect: Appropriate  Cognitive: Oriented  Insight: Developing/Improving  Engagement in Therapy: Engaged  Modes of Intervention: Discussion, Education and Socialization  Summary of Progress/Problems: Mental Health Association (MHA) Speaker came to talk about his personal journey with mental health. The pt processed ways by which to relate to the speaker. MHA speaker provided handouts and educational information pertaining to groups and services offered by the [facility]. Pt was engaged in speaker's presentation and was receptive to resources provided.    [employer], LCSW 05/28/2017 2:06 PM

## 2017-05-28 NOTE — Social Work (Signed)
Referred to [facility], is [place] resident.  Anne Cunningham, LCSW Lead Clinical Social Worker Phone:  [phone]  

## 2017-05-28 NOTE — Progress Notes (Signed)
Patient attended NA group meeting. 

## 2017-05-28 NOTE — Progress Notes (Signed)
NUTRITION ASSESSMENT  Pt identified as at risk on the Malnutrition Screen Tool  INTERVENTION: 1. Supplements: Ensure Enlive po BID, each supplement provides 350 kcal and 20 grams of protein  NUTRITION DIAGNOSIS: Unintentional weight loss related to sub-optimal intake as evidenced by pt report.   Goal: Pt to meet >/= 90% of their estimated nutrition needs.  Monitor:  PO intake  Assessment:  Pt admitted for depression and polysubstance abuse (ETOH, cocaine). Pt reports drinking 3 40oz beers and using 1g of cocaine daily. Pt with increased needs, will order Ensure supplements. Pt is underweight per BMI. Pt was eating 100% in [facility] ED.   Height: Ht Readings from Last 1 Encounters:  05/27/17  (1.676 m)    Weight: Wt Readings from Last 1 Encounters:  05/27/17 112 lb 8 oz (51 kg)    Weight Hx: Wt Readings from Last 10 Encounters:  05/27/17 112 lb 8 oz (51 kg)    BMI:  Body mass index is 18.16 kg/m. Pt meets criteria for underweight based on current BMI.  Estimated Nutritional Needs: Kcal: 25-30 kcal/kg Protein: > 1 gram protein/kg Fluid: 1 ml/kcal  Diet Order: Diet regular Room service appropriate? Yes; Fluid consistency: Thin Pt is also offered choice of unit snacks mid-morning and mid-afternoon.  Pt is eating as desired.   Lab results and medications reviewed.   Tilda Franco, MS, RD, LDN Pager: [phone] After Hours Pager: [phone]
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
## 2017-05-28 NOTE — Progress Notes (Signed)
Recreation Therapy Notes  Date: 05/28/17 Time: 0930 Location: 300 Hall Dayroom  Group Topic: Stress Management  Goal Area(s) Addresses:  Patient will verbalize importance of using healthy stress management.  Patient will identify positive emotions associated with healthy stress management.   Behavioral Response: Engaged  Intervention: Stress Management  Activity :  Meditation.  LRT introduced the stress management technique of meditation.  LRT played Reeves meditation from the Calm app that allowed patients the opportunity to take inventory of any feelings or sensations they were feeling throughout their bodies.  Patients were to listen and follow along as the meditation played.  Education:  Stress Management, Discharge Planning.   Education Outcome: Acknowledges edcuation/In group clarification offered/Needs additional education  Clinical Observations/Feedback: Pt attended group.   Matthew Reeves, LRT/CTRS         Matthew Reeves 05/28/2017 11:31 AM 

## 2017-05-29 DIAGNOSIS — F329 Major depressive disorder, single episode, unspecified: Secondary | ICD-10-CM

## 2017-05-29 NOTE — Progress Notes (Addendum)
Patient attended group and said that his day was a 10. He was excited to be going home tomorrow. His coping skills were sleep, watching tx and socializing.

## 2017-05-29 NOTE — BHH Group Notes (Signed)
LCSW Group Therapy Note  05/29/2017 1:15pm  Type of Therapy and Topic:  Group Therapy: Avoiding Self-Sabotaging and Enabling Behaviors  Participation Level:  Minimal   Description of Group:   In this group, patients will learn how to identify obstacles, self-sabotaging and enabling behaviors, as well as: what are they, why do we do them and what needs these behaviors meet. Discuss unhealthy relationships and how to have positive healthy boundaries with those that sabotage and enable. Explore aspects of self-sabotage and enabling in yourself and how to limit these self-destructive behaviors in everyday life.   Therapeutic Goals: 1. Patient will identify one obstacle that relates to self-sabotage and enabling behaviors 2. Patient will identify one personal self-sabotaging or enabling behavior they did prior to admission 3. Patient will state a plan to change the above identified behavior 4. Patient will demonstrate ability to communicate their needs through discussion and/or role play.   Summary of Patient Progress:  Matthew Reeves attended group but was inattentive and disengaged in the group setting. He rarely looked at his sign language interpretor during group. Matthew Reeves shared that he had no concerns and has a headache from the medication he was put on. At this time, Matthew Reeves demonstrates minimal insight and limited progress in the group setting.    Therapeutic Modalities:   Cognitive Behavioral Therapy Person-Centered Therapy Motivational Interviewing   [employer], LCSW 05/29/2017 1:25 PM

## 2017-05-29 NOTE — Progress Notes (Addendum)
[facility] MD Progress Note ( Interviewed with Sign Language Interpreter) 05/29/2017 4:03 PM Matthew Reeves  MRN:  [id number] Subjective:  Patient reports he is feeling " all right today". Denies depression. Denies side effects except for mild headache. Does not feel he needs an antidepressant medication at this time. States " really I am not that depressed, and now that I am not using drugs I feel pretty good". States he does not feel he needs any antidepressant medication at this time. Denies medication side effects.  Objective : I have discussed case with treatment team and have met with patient. Patient presents alert, attentive, calm. Reports he is feeling better and currently denies /minimizes depression . States he is feeling "OK", denies suicidal ideations and does not endorse significant neuro-vegetative symptoms of depression at this time . No disruptive or agitated behaviors on unit. Pleasant on approach. Reports mild headache associated with current medication management, and states he does not feel he needs  antidepressant management as his mood is improving in the context of sobriety.   Principal Problem: Alcohol use disorder, moderate, dependence ([place]) Diagnosis:   Patient Active Problem List   Diagnosis Date Noted  . Cocaine use disorder, moderate, dependence ([place]) [F14.20] 05/28/2017  . Alcohol use disorder, moderate, dependence ([place]) [F10.20] 05/28/2017  . MDD (major depressive disorder) [F32.9] 05/27/2017   Total Time spent with patient: 20 minutes   Past Medical History:  Past Medical History:  Diagnosis Date  . Alcohol abuse   . Deaf    congenital - HOH  . Polysubstance abuse ([place])    History reviewed. No pertinent surgical history. Family History: History reviewed. No pertinent family history. Social History:  History  Alcohol use Not on file     History  Drug use: Unknown    Social History   Social History  . Marital status: Single    Spouse name: N/A  .  Number of children: N/A  . Years of education: N/A   Social History Main Topics  . Smoking status: Current Every Day Smoker    Packs/day: 1.00  . Smokeless tobacco: Never Used  . Alcohol use None  . Drug use: Unknown  . Sexual activity: Not Asked   Other Topics Concern  . None   Social History Narrative  . None   Additional Social History:   Sleep: Good  Appetite:  Good  Current Medications: Current Facility-Administered Medications  Medication Dose Route Frequency Provider Last Rate Last Dose  . acetaminophen (TYLENOL) tablet 650 mg  650 mg Oral Q8H PRN Okonkwo, Justina A, NP   650 mg at 05/29/17 1516  . alum & mag hydroxide-simeth (MAALOX/MYLANTA) 200-200-20 MG/5ML suspension 30 mL  30 mL Oral Q4H PRN Okonkwo, Justina A, NP      . chlordiazePOXIDE (LIBRIUM) capsule 25 mg  25 mg Oral Q6H PRN Rashawnda Gaba, Myer Peer, MD      . feeding supplement (ENSURE ENLIVE) (ENSURE ENLIVE) liquid 237 mL  237 mL Oral BID BM Ronisha Herringshaw, Myer Peer, MD      . hydrOXYzine (ATARAX/VISTARIL) tablet 25 mg  25 mg Oral Q6H PRN Pernell Dikes A, MD      . loperamide (IMODIUM) capsule 2-4 mg  2-4 mg Oral PRN Ieesha Abbasi, Myer Peer, MD      . magnesium hydroxide (MILK OF MAGNESIA) suspension 30 mL  30 mL Oral Daily PRN Okonkwo, Justina A, NP      . multivitamin with minerals tablet 1 tablet  1 tablet Oral Daily Marshall Roehrich, Felicita Gage  A, MD   1 tablet at 05/29/17 0826  . nicotine (NICODERM CQ - dosed in mg/24 hours) patch 21 mg  21 mg Transdermal Daily Keely Drennan A, MD      . ondansetron (ZOFRAN-ODT) disintegrating tablet 4 mg  4 mg Oral Q6H PRN Pebble Botkin A, MD      . thiamine (VITAMIN B-1) tablet 100 mg  100 mg Oral Daily Oracio Galen, Myer Peer, MD   100 mg at 05/29/17 0826  . traZODone (DESYREL) tablet 50 mg  50 mg Oral QHS PRN Okonkwo, Justina A, NP        Lab Results: No results found for this or any previous visit (from the past 48 hour(s)).  Blood Alcohol level:  Lab Results  Component Value Date   ETH <10  05/27/2017   [facility]  10/10/2010    <5        LOWEST DETECTABLE LIMIT FOR SERUM ALCOHOL IS 5 mg/dL FOR MEDICAL PURPOSES ONLY    Metabolic Disorder Labs: No results found for: HGBA1C, MPG No results found for: PROLACTIN No results found for: CHOL, TRIG, HDL, CHOLHDL, VLDL, LDLCALC  Physical Findings: AIMS: Facial and Oral Movements Muscles of Facial Expression: None, normal Lips and Perioral Area: None, normal Jaw: None, normal Tongue: None, normal,Extremity Movements Upper (arms, wrists, hands, fingers): None, normal Lower (legs, knees, ankles, toes): None, normal, Trunk Movements Neck, shoulders, hips: None, normal, Overall Severity Severity of abnormal movements (highest score from questions above): None, normal Incapacitation due to abnormal movements: None, normal Patient's awareness of abnormal movements (rate only patient's report): No Awareness, Dental Status Current problems with teeth and/or dentures?: No Does patient usually wear dentures?: No  CIWA:  CIWA-Ar Total: 1 COWS:  COWS Total Score: 0  Musculoskeletal: Strength & Muscle Tone: within normal limits Gait & Station: normal Patient leans: N/A  Psychiatric Specialty Exam: Physical Exam  ROS denies chest pain, denies dyspnea, no vomiting, reports headache .  Blood pressure 127/79, pulse (!) 55, temperature 97.6 F (36.4 C), temperature source Oral, resp. rate 15, height _0  (1.676 m), weight 51 kg (112 lb 8 oz), SpO2 100 %.Body mass index is 18.16 kg/m.  General Appearance: Fairly Groomed  Eye Contact:  Good  Speech:  Normal Rate  Volume:  Normal  Mood:  states mood is better, and currently does not endorse feeling depressed   Affect:  slightly restricted, but improves during session and smiles at times appropriately   Thought Process:  Linear and Descriptions of Associations: Intact  Orientation:  Full (Time, Place, and Person)  Thought Content:  denies hallucinations, no delusions, not internally  preoccupied  Suicidal Thoughts:  No denies any suicidal or self injurious ideations, denies homicidal or violent ideations  Homicidal Thoughts:  No  Memory:  recent and remote grossly intact   Judgement:  Other:  improving   Insight:  improving   Psychomotor Activity:  Normal  Concentration:  Concentration: Good and Attention Span: Good  Recall:  Good  Fund of Knowledge:  Good  Language:  Good  Akathisia:  Negative  Handed:  Right  AIMS (if indicated):     Assets:  Communication Skills Desire for Improvement Resilience  ADL's:  Intact  Cognition:  WNL  Sleep:  Number of Hours: 6.75   Assessment - patient is currently presenting with improving mood and range of affect, denies feeling depressed at this time. No suicidal ideations.Presents future oriented, and is wanting to follow up at [facility] after discharge. At this  time not presenting with symptoms of  WDL.   Treatment Plan Summary: Daily contact with patient to assess and evaluate symptoms and progress in treatment, Medication management, Plan inpatient treatment  and medications as below Encourage group and milieu participation to work on coping skills and symptom reduction Continue Librium PRN for potential alcohol WDL symptoms  Continue Vistaril 25 mgrs Q 6 hours PRN for anxiety as needed  Continue Trazodone 50 mgrs QHS PRN for insomnia as needed  Treatment team working on disposition planning options  Jenne Campus, MD 05/29/2017, 4:03 PM

## 2017-05-29 NOTE — Plan of Care (Signed)
Problem: Medication: Goal: Compliance with prescribed medication regimen will improve Outcome: Progressing Patient took all meds as scheduled  Comments: Sign language interpreter was able to assist this RN to talk with patient.  During interaction, patient denies SI/HI/AVH.  Patient reported a headache of 5/10, and was medicated with Tylenol .  Patient took all other meds as scheduled, reports his appetite as being good, and denies having any other concerns.  Patient was encouraged to complete his self inventory form and return it to RN, but did not complete one.  Q15 minute safety checks maintained through entire shift, pt ate all of his meals for shift, and was visible in the milieu interacting in the day room with peers.  Report to be given to oncoming shift.

## 2017-05-29 NOTE — Progress Notes (Addendum)
Patient ID: Matthew Reeves, male   DOB: 10-03-1985, 31 y.o.   MRN: [id number]  Pt currently presents with a flat affect and guarded behavior. Pt interacts minimally with peers. Pt has concerns about his multivitamin, he feels it is giving him headaches and does not want to take it anymore. Pt states "I don't think I need to take any medications, I am doing better." Pt reports good sleep without taking any medications. Denies any chest pain today.   Pt's labs and vitals were monitored throughout the night. Pt given a 1:1 about emotional and mental status. Pt supported and encouraged to express concerns and questions. Assessment completed with the assistance of an interpreter.   Pt's safety ensured with 15 minute and environmental checks. Pt currently denies SI/HI and A/V hallucinations. Pt verbally agrees to seek staff if SI/HI or A/VH occurs and to consult with staff before acting on any harmful thoughts. Will continue POC.

## 2017-05-30 MED ORDER — TRAZODONE HCL 50 MG PO TABS: 50.0000 mg | ORAL_TABLET | Freq: Every evening | ORAL | 0 refills | Status: DC | PRN

## 2017-05-30 MED ORDER — HYDROXYZINE HCL 25 MG PO TABS: ORAL_TABLET | ORAL | 0 refills | Status: DC

## 2017-05-30 MED ORDER — NICOTINE 21 MG/24HR TD PT24: 21.0000 mg | MEDICATED_PATCH | Freq: Every day | TRANSDERMAL | 0 refills | Status: DC

## 2017-05-30 NOTE — Progress Notes (Signed)
Patient ID: Matthew Reeves, male   DOB: 03-Sep-1985, 31 y.o.   MRN: [id number]  DAR: Pt. Denies SI/HI and A/V Hallucinations. He reports on his daily inventory that his sleep last night was good, his appetite is good, his energy level is normal, and his concentration level is good. He reports that he has no depression or hopelessness and opts to leave the anxiety rating blank on his inventory sheet. Patient does not report any pain or discomfort at this time. Support and encouragement provided to the patient. Scheduled medications were refused by this patient. He reports, through use of interpreter, that he does not feel like he needs medications. Patient is seen in the milieu drinking coffee and interacting with his interpreter. He is somewhat guarded and minimal with this Clinical research associate. He reports, through interpreter, that he feels ready for discharge. Q15 minute checks are maintained for safety.

## 2017-05-30 NOTE — BHH Suicide Risk Assessment (Addendum)
[facility] Discharge Suicide Risk Assessment ( patient interviewed via sign language interpreter)   Principal Problem: Alcohol use disorder, moderate, dependence (HCC) Discharge Diagnoses:  Patient Active Problem List   Diagnosis Date Noted  . Cocaine use disorder, moderate, dependence (HCC) [F14.20] 05/28/2017  . Alcohol use disorder, moderate, dependence (HCC) [F10.20] 05/28/2017  . MDD (major depressive disorder) [F32.9] 05/27/2017    Total Time spent with patient: 30 minutes  Musculoskeletal: Strength & Muscle Tone: within normal limits Gait & Station: normal Patient leans: N/A  Psychiatric Specialty Exam: ROSstates headache he had been experiencing has resolved, denies chest pain, no dyspnea- no symptoms of alcohol WDL- no tremors, no diaphoresis, no restlessness , no flushing  Blood pressure 114/76, pulse (!) 52, temperature 98.2 F (36.8 C), resp. rate 18, height  (1.676 m), weight 51 kg (112 lb 8 oz), SpO2 100 %.Body mass index is 18.16 kg/m.  General Appearance: Well Groomed  Eye Contact::  Good  Speech:  Normal Rate409  Volume:  Normal  Mood:  improved, denies depression, presents euthymic   Affect:  Appropriate and Full Range  Thought Process:  Linear and Descriptions of Associations: Intact  Orientation:  Full (Time, Place, and Person)  Thought Content:  denies hallucinations, no delusions, not internally preoccupied   Suicidal Thoughts:  No denies suicidal or self injurious ideations  Homicidal Thoughts:  No denies homicidal or violent ideations   Memory:  recent and remote grossly intact   Judgement:  recent and remote grossly intact   Insight:  improved  Psychomotor Activity:  Normal  Concentration:  Good  Recall:  Good  Fund of Knowledge:Good  Language: Good  Akathisia:  Negative  Handed:  Right  AIMS (if indicated):     Assets:  Desire for Improvement Resilience  Sleep:  Number of Hours: 6.75  Cognition: WNL  ADL's:  Intact   Mental Status Per  Nursing Assessment::   On Admission:  NA  Demographic Factors:  31 year old male, lives with sister , unemployed , on disability  Loss Factors: Substance abuse, disability   Historical Factors: History of substance abuse, identifies cocaine as substance of choice, history of depression  Risk Reduction Factors:   Sense of responsibility to family, Living with another person, especially a relative, Positive social support and Positive coping skills or problem solving skills  Continued Clinical Symptoms:  At this time patient reports feeling better, presents calm, pleasant on approach, mood improved and currently denies depression and presents euthymic, affect full in range, no thought disorder, no suicidal or self injurious ideations, no homicidal or violent ideations, no psychotic symptoms , future oriented. Behavior on unit calm and in good control .  Cognitive Features That Contribute To Risk:  No gross cognitive deficits noted upon discharge. Is alert , attentive, and oriented x 3   Suicide Risk:  Mild:  Suicidal ideation of limited frequency, intensity, duration, and specificity.  There are no identifiable plans, no associated intent, mild dysphoria and related symptoms, good self-control (both objective and subjective assessment), few other risk factors, and identifiable protective factors, including available and accessible social support.  Follow-up Information    [facility] Follow up on 06/04/2017.   Specialty:  Behavioral Health Why:  Assessment with [facility]. [facility] on Wednesday at 10:00AM for SAIOP and medication management. Please bring ID and medicare/medicaid card to this appt. Thank you.  Contact information: [address] [place] [address] [phone]  Plan Of Care/Follow-up recommendations:  Activity:  as tolerated  Diet:  regular Tests:  NA Other:  see below  Patient is expressing readiness for discharge and is leaving unit in good  spirits  Plans to return home  Expresses motivation in sobriety and plans to follow up as above .  Craige Cotta, MD 05/30/2017, 11:05 AM

## 2017-05-30 NOTE — Progress Notes (Signed)
Recreation Therapy Notes  Date: 05/30/17 Time: 0930 Location: 300 Hall Dayroom  Group Topic: Stress Management  Goal Area(s) Addresses:  Patient will verbalize importance of using healthy stress management.  Patient will identify positive emotions associated with healthy stress management.   Behavioral Response: Engaged  Intervention: Stress Management  Activity :  Progressive Muscle Relaxation.  LRT introduced the stress management technique of progressive muscle relaxation.  LRT read Reeves script to guide patients through tensing and relaxing each muscle group.  Patients were to follow along as script was read to engage in the activity.  Education:  Stress Management, Discharge Planning.   Education Outcome: Acknowledges edcuation/In group clarification offered/Needs additional education  Clinical Observations/Feedback: Pt attended group.   Matthew Reeves, LRT/CTRS         Matthew Reeves 05/30/2017 11:18 AM 

## 2017-05-30 NOTE — Discharge Summary (Signed)
Physician Discharge Summary Note  Patient:  Matthew Reeves is an 31 y.o., male MRN:  [id number]  DOB:  [birth year]  Patient phone:  [phone] (home)   Patient address:   [address] [place] [address],  Total Time spent with patient: Greater than 30 minutes  Date of Admission:  05/27/2017 Date of Discharge: 05-30-17  Reason for Admission: Alcohol/Cocaine induced chest pains.   Principal Problem: Alcohol use disorder, moderate, dependence (HCC)  Discharge Diagnoses: Patient Active Problem List   Diagnosis Date Noted  . Cocaine use disorder, moderate, dependence (HCC) [F14.20] 05/28/2017  . Alcohol use disorder, moderate, dependence (HCC) [F10.20] 05/28/2017  . MDD (major depressive disorder) [F32.9] 05/27/2017   Past Psychiatric History: Hx. Cocaine/Alcohol use disorder, MDD.  Past Medical History:  Past Medical History:  Diagnosis Date  . Alcohol abuse   . Deaf    congenital - HOH  . Polysubstance abuse (HCC)    History reviewed. No pertinent surgical history.  Family History: History reviewed. No pertinent family history.  Family Psychiatric  History: See H&P.  Social History:  History  Alcohol use Not on file     History  Drug use: Unknown    Social History   Social History  . Marital status: Single    Spouse name: N/A  . Number of children: N/A  . Years of education: N/A   Social History Main Topics  . Smoking status: Current Every Day Smoker    Packs/day: 1.00  . Smokeless tobacco: Never Used  . Alcohol use None  . Drug use: Unknown  . Sexual activity: Not Asked   Other Topics Concern  . None   Social History Narrative  . None   Hospital Course:This is an admission assessment for this 31 year old AA male with history of Cocaine & alcohol use disorder. He is also hearing impaired. Speaks some what clearly & able to understand spoken words. He also reads lip & able to ask for a repeat of what he could not understand. He is being admitted to  the [facility] from the [facility] with complaints of suicidal ideations & chest pain. His UDS was positive for Cocaine & THC.  During this assessment, [name] reports, "The ambulance took me to the [facility] last night. I was out of breath because my chest was hurting. Every time I drank alcohol or smoke crack, my chest will hurt. I use crack once a week & drank alcohol daily. I drink like 2-3 bottles of the 40 ounces of beer at one time on daily basis. I'm an alcoholic, having been drinking heavily since age 45. I have been smoking crack for the last 6 months. I use crack & drink alcohol because I'm stressed a lot. I came to this hospital to get help & learn to live the right way. I don't have any diagnosis of mental illness. I'm a little sad, but do not need medicines for it. I will prefer substance abuse treatment program instead. May be go back to the [facility] again. I was there a long time ago for substance abuse treatment. I need to be busy doing something or I will continue to drink & use because life is so stressful".   After the above admission assessment, [name] was started on the medication regimen for his presenting symptoms. He was medication & discharged on; Hydroxyzine 25 mg prn for anxiety, Nicotine patch 21 mg for smoking cessation & Trazodone 50 mg for insomnia. He was  enrolled & participated in the group counseling sessions being offered & held on this unit. He learned coping skills. He presented no other significant health issues that required treatment. He tolerated his treatment regimen without any adverse effects or reactions required.  [name] is seen today by the attending psychiatrist. Pleased that he sought help. Says he is tolerating his medications well. No withdrawal symptoms. No craving for alcohol. He is no longer feeling depressed. Describes normal energy and ability to think. Able to focus on task. No suicidal thoughts. No homicidal thoughts. No thoughts of  violence. Patient reports normal biological functions.   The nursing staff reports that patient has been appropriate on the unit. Patient has been interacting well with peers & staff. No behavioral issues. Patient has not voiced any suicidal thoughts. Patient has not been observed to be internally stimulated or occupied. Patient has been adherent with treatment recommendations. Patient has been tolerating their medication well, denies any adverse reactions or side effects.   Patient was discussed at team meeting this morning. The team members feel that patient is back to his baseline level of function. Team agrees with plan to discharge patient today to continue mental health care & substance abuse treatment as noted below. Upon discharge, [name] adamantly denies any SIHI, AVH, delusional thoughts, paranoia or substance withdrawal symptoms. He will continue further psychiatric follow-up care/medication management on an outpatient basis as noted below. He was provided with all the necessary information needed to make this appointment without problems. He left [facility] with all personal belongings in no apparent distress. Transportation per his arrangement.  Physical Findings: AIMS: Facial and Oral Movements Muscles of Facial Expression: None, normal Lips and Perioral Area: None, normal Jaw: None, normal Tongue: None, normal,Extremity Movements Upper (arms, wrists, hands, fingers): None, normal Lower (legs, knees, ankles, toes): None, normal, Trunk Movements Neck, shoulders, hips: None, normal, Overall Severity Severity of abnormal movements (highest score from questions above): None, normal Incapacitation due to abnormal movements: None, normal Patient's awareness of abnormal movements (rate only patient's report): No Awareness, Dental Status Current problems with teeth and/or dentures?: No Does patient usually wear dentures?: No  CIWA:  CIWA-Ar Total: 1 COWS:  COWS Total Score:  0  Musculoskeletal: Strength & Muscle Tone: within normal limits Gait & Station: normal Patient leans: N/A  Psychiatric Specialty Exam: Physical Exam  Constitutional: He appears well-developed.  HENT:  Head: Normocephalic.  Eyes: Pupils are equal, round, and reactive to light.  Neck: Normal range of motion.  Cardiovascular: Normal rate.   Respiratory: Effort normal.  GI: Soft.  Genitourinary:  Genitourinary Comments: Deferred  Musculoskeletal: Normal range of motion.  Neurological: He is alert.  Skin: Skin is warm.    Review of Systems  Constitutional: Negative.   HENT: Negative.   Eyes: Negative.   Respiratory: Negative.   Cardiovascular: Negative.   Gastrointestinal: Negative.   Genitourinary: Negative.   Musculoskeletal: Negative.   Skin: Negative.   Neurological: Negative.   Endo/Heme/Allergies: Negative.   Psychiatric/Behavioral: Positive for depression (Stable) and substance abuse (Hx. alcoholis, Cocaine & cannabis use disorder.). Negative for hallucinations, memory loss and suicidal ideas. The patient has insomnia (Stable). The patient is not nervous/anxious.     Blood pressure 114/76, pulse (!) 52, temperature 98.2 F (36.8 C), resp. rate 18, height  (1.676 m), weight 51 kg (112 lb 8 oz), SpO2 100 %.Body mass index is 18.16 kg/m.  See H&P   Have you used any form of tobacco in the last 30 days? (  Cigarettes, Smokeless Tobacco, Cigars, and/or Pipes): Yes  Has this patient used any form of tobacco in the last 30 days? (Cigarettes, Smokeless Tobacco, Cigars, and/or Pipes): Yes, recommended Nicotine patch 21 mg prescription for smoking cessation.  Blood Alcohol level:  Lab Results  Component Value Date   ETH <10 05/27/2017   [facility]  10/10/2010    <5        LOWEST DETECTABLE LIMIT FOR SERUM ALCOHOL IS 5 mg/dL FOR MEDICAL PURPOSES ONLY   Metabolic Disorder Labs:  No results found for: HGBA1C, MPG No results found for: PROLACTIN No results found for: CHOL,  TRIG, HDL, CHOLHDL, VLDL, LDLCALC  See Psychiatric Specialty Exam and Suicide Risk Assessment completed by Attending Physician prior to discharge.  Discharge destination:  Home  Is patient on multiple antipsychotic therapies at discharge:  No   Has Patient had three or more failed trials of antipsychotic monotherapy by history:  No  Recommended Plan for Multiple Antipsychotic Therapies: NA  Allergies as of 05/30/2017   No Known Allergies     Medication List    TAKE these medications     Indication  hydrOXYzine 25 MG tablet Commonly known as:  ATARAX/VISTARIL Take 1 tablet (25 mg) four times daily as needed: For anxiety.  Indication:  Feeling Anxious   nicotine 21 mg/24hr patch Commonly known as:  NICODERM CQ - dosed in mg/24 hours Place 1 patch (21 mg total) onto the skin daily. For smoking cessation  Indication:  Nicotine Addiction   traZODone 50 MG tablet Commonly known as:  DESYREL Take 1 tablet (50 mg total) by mouth at bedtime as needed for sleep.  Indication:  Trouble Sleeping      Follow-up [facility] Follow up on 06/04/2017.   Specialty:  Behavioral Health Why:  Assessment with [facility]. [facility] on Wednesday at 10:00AM for SAIOP and medication management. Please bring ID and medicare/medicaid card to this appt. Thank you.  Contact information: [address] [place] [address] [phone]          Follow-up recommendations: Activity:  As tolerated Diet: As recommended by your primary care doctor. Keep all scheduled follow-up appointments as recommended.  Comments: Patient is instructed prior to discharge to: Take all medications as prescribed by his/her mental healthcare provider. Report any adverse effects and or reactions from the medicines to his/her outpatient provider promptly. Patient has been instructed & cautioned: To not engage in alcohol and or illegal drug use while on prescription medicines. In the event of  worsening symptoms, patient is instructed to call the crisis hotline, 911 and or go to the nearest ED for appropriate evaluation and treatment of symptoms. To follow-up with his/her primary care provider for your other medical issues, concerns and or health care needs.   Signed: Sanjuana Kava, NP, PMHNP, FNP-BC. 05/30/2017, 10:23 AM   Patient seen, Suicide Assessment Completed.  Disposition Plan Reviewed

## 2017-05-30 NOTE — Progress Notes (Signed)
  [facility] Case Management Discharge Plan :  Will you be returning to the same living situation after discharge:  Yes,  pt returning home. At discharge, do you have transportation home?: Yes,  pt has access to transportation. Do you have the ability to pay for your medications: Yes,  pt has insurance.  Release of information consent forms completed and in the chart;  Patient's signature needed at discharge.  Patient to Follow up at: Follow-up Information    [facility] Follow up on 06/04/2017.   Specialty:  Behavioral Health Why:  Assessment with Mr. [facility] on Wednesday at 10:00AM for SAIOP and medication management. Please bring ID and medicare/medicaid card to this appt. Thank you.  Contact information: [address] [place] [address] [phone]           Next level of care provider has access to [facility] Link:no  Safety Planning and Suicide Prevention discussed: Yes,  with pt.  Have you used any form of tobacco in the last 30 days? (Cigarettes, Smokeless Tobacco, Cigars, and/or Pipes): Yes  Has patient been referred to the Quitline?: Patient refused referral  Patient has been referred for addiction treatment: Yes  Jonathon Jordan, MSW, LCSWA 05/30/2017, 10:01 AM

## 2017-05-30 NOTE — Progress Notes (Signed)
Patient ID: Matthew Reeves, male   DOB: February 01, 1986, 31 y.o.   MRN: [id number]  Discharge Note- Belongings returned to patient at time of discharge. Discharge instructions and medications were reviewed with patient with the aide of an interpreter. Patient verbalized understanding of both medications and discharge instructions. Patient was discharged to lobby with 2 bus passes. Patient, through sign language, verifies that he understands how to get to the bus stop. Patient discharged with no apparent distress. Q15 minute safety checks maintained until time of discharge.

## 2017-06-05 ENCOUNTER — Emergency Department (HOSPITAL_COMMUNITY): Payer: Medicare Other

## 2017-06-05 ENCOUNTER — Encounter (HOSPITAL_COMMUNITY): Payer: Self-pay

## 2017-06-05 ENCOUNTER — Emergency Department (HOSPITAL_COMMUNITY)
Admission: EM | Admit: 2017-06-05 | Discharge: 2017-06-05 | Disposition: A | Discharge status: Home / Self Care | Payer: Medicare Other | Attending: Emergency Medicine | Admitting: Emergency Medicine

## 2017-06-05 DIAGNOSIS — S61219A Laceration without foreign body of unspecified finger without damage to nail, initial encounter: Secondary | ICD-10-CM

## 2017-06-05 DIAGNOSIS — Y939 Activity, unspecified: Secondary | ICD-10-CM | POA: Diagnosis not present

## 2017-06-05 DIAGNOSIS — W25XXXA Contact with sharp glass, initial encounter: Secondary | ICD-10-CM | POA: Insufficient documentation

## 2017-06-05 DIAGNOSIS — F17210 Nicotine dependence, cigarettes, uncomplicated: Secondary | ICD-10-CM | POA: Insufficient documentation

## 2017-06-05 DIAGNOSIS — Y999 Unspecified external cause status: Secondary | ICD-10-CM | POA: Insufficient documentation

## 2017-06-05 DIAGNOSIS — S6991XA Unspecified injury of right wrist, hand and finger(s), initial encounter: Secondary | ICD-10-CM | POA: Diagnosis present

## 2017-06-05 DIAGNOSIS — Y929 Unspecified place or not applicable: Secondary | ICD-10-CM | POA: Diagnosis not present

## 2017-06-05 DIAGNOSIS — S61411A Laceration without foreign body of right hand, initial encounter: Secondary | ICD-10-CM | POA: Diagnosis not present

## 2017-06-05 MED ORDER — CEPHALEXIN 500 MG PO CAPS: 500.0000 mg | ORAL_CAPSULE | Freq: Four times a day (QID) | ORAL | 0 refills | Status: AC

## 2017-06-05 NOTE — ED Notes (Signed)
Pt called x1. No answer. 

## 2017-06-05 NOTE — Discharge Instructions (Signed)
Please read attached information. If you experience any new or worsening signs or symptoms please return to the emergency room for evaluation. Please follow-up with your primary care provider or specialist as discussed. Please use medication prescribed only as directed and discontinue taking if you have any concerning signs or symptoms.   °

## 2017-06-05 NOTE — ED Triage Notes (Addendum)
[facility]- pt coming from home with complaint of a finger laceration X2 days.  Pt alert and oriented, ambulatory, vitals listed below. Finger lac has minimal clear drainage. No Pus noted, unknown last teatnus shot.   118/60, HR 70, SPO2 100%, 18 resp CBG 118

## 2017-06-05 NOTE — ED Provider Notes (Signed)
[facility]-EMERGENCY DEPT Provider Note   CSN: [id number] Arrival date & time: 06/05/17  1234     History   Chief Complaint Chief Complaint  Patient presents with  . Finger Injury    HPI Carl Orr is a 31 y.o. male.  HPI   31 year old male presents today with complaints of laceration to his right finger.  Patient notes that over 24 hours ago he cut his finger on a piece of glass in the sink.  He notes that he rinsed this with alcohol and hydrogen peroxide.  He notes some minor swelling around the site of the laceration.  He denies any systemic illnesses, denies any redness, discharge.  Patient reports pain with flexion of the MCP, no decreased extension at the MCP PIP or the IP.  Distal sensation intact.  Past Medical History:  Diagnosis Date  . Deaf     Patient Active Problem List   Diagnosis Date Noted  . Alcohol use disorder, moderate, dependence (HCC) 02/27/2016    Past Surgical History:  Procedure Laterality Date  . ABDOMINAL SURGERY     at birth  . KNEE SURGERY    . TESTICLE TORSION REDUCTION         Home Medications    Prior to Admission medications   Medication Sig Start Date End Date Taking? Authorizing Provider  cephALEXin (KEFLEX) 500 MG capsule Take 1 capsule (500 mg total) by mouth 4 (four) times daily. 06/05/17   Raheen Capili, Tinnie Gens, PA-C  hydrOXYzine (ATARAX/VISTARIL) 25 MG tablet Take 1 tablet (25 mg total) by mouth every 6 (six) hours as needed for anxiety. 03/01/16   Adonis Brook, NP    Family History History reviewed. No pertinent family history.  Social History Social History  Substance Use Topics  . Smoking status: Current Every Day Smoker    Packs/day: 0.50    Types: Cigarettes  . Smokeless tobacco: Never Used  . Alcohol use No     Allergies   Hydrocodone   Review of Systems Review of Systems  All other systems reviewed and are negative.    Physical Exam Updated Vital Signs BP 112/71 (BP Location: Left Arm)   Pulse 65    Temp 98.4 F (36.9 C) (Oral)   Resp 16   SpO2 100%   Physical Exam  Constitutional: He is oriented to person, place, and time. He appears well-developed and well-nourished.  HENT:  Head: Normocephalic and atraumatic.  Eyes: Pupils are equal, round, and reactive to light. Conjunctivae are normal. Right eye exhibits no discharge. Left eye exhibits no discharge. No scleral icterus.  Neck: Normal range of motion. No JVD present. No tracheal deviation present.  Pulmonary/Chest: Effort normal. No stridor.  Musculoskeletal:  U-shaped laceration over the right MCP radial side, granulation tissue and serous drainage, no purulence, no surrounding redness, minor edema at the site of the laceration.  Full extension of the MCP DIP and PIP pain with flexion of the MCP  Neurological: He is alert and oriented to person, place, and time. Coordination normal.  Psychiatric: He has a normal mood and affect. His behavior is normal. Judgment and thought content normal.  Nursing note and vitals reviewed.    ED Treatments / Results  Labs (all labs ordered are listed, but only abnormal results are displayed) Labs Reviewed - No data to display  EKG  EKG Interpretation None       Radiology Dg Finger Index Right  Result Date: 06/05/2017 CLINICAL DATA:  Right index finger laceration. EXAM: RIGHT INDEX  FINGER 2+V COMPARISON:  None. FINDINGS: There is no evidence of fracture or dislocation. There is no evidence of arthropathy or other focal bone abnormality. Soft tissues are unremarkable. IMPRESSION: Normal right index finger. Electronically Signed   By: Lupita Raider, M.D.   On: 06/05/2017 17:05    Procedures Procedures (including critical care time)  Medications Ordered in ED Medications - No data to display   Initial Impression / Assessment and Plan / ED Course  I have reviewed the triage vital signs and the nursing notes.  Pertinent labs & imaging results that were available during my care of  the patient were reviewed by me and considered in my medical decision making (see chart for details).      Final Clinical Impressions(s) / ED Diagnoses   Final diagnoses:  Laceration of skin of finger, initial encounter    Labs:   Imaging: DG finger index right  Consults:  Therapeutics:  Discharge Meds: Keflex  Assessment/Plan: 31 year old male presents today with laceration over his right dorsal hand.  No signs of deep space involvement, this is already appearing to heal with obvious granulation tissue.  Patient does have a little bit of edema around the site of the laceration, no signs of purulent infection.  Patient will be covered with prophylactic antibiotics, wound care instructions given, strict return precautions given.  Patient with no obvious tendon involvement.  Patient verbalized understanding and agreement to today's plan had no further questions or concerns      New Prescriptions New Prescriptions   CEPHALEXIN (KEFLEX) 500 MG CAPSULE    Take 1 capsule (500 mg total) by mouth 4 (four) times daily.     Eyvonne Mechanic, PA-C 06/05/17 1747    Pricilla Loveless, MD 06/06/17 [id number]

## 2017-10-25 ENCOUNTER — Encounter (HOSPITAL_COMMUNITY): Payer: Self-pay | Admitting: Emergency Medicine

## 2017-10-25 ENCOUNTER — Emergency Department (HOSPITAL_COMMUNITY)
Admission: EM | Admit: 2017-10-25 | Discharge: 2017-10-25 | Disposition: A | Discharge status: Home / Self Care | Payer: Medicare Other | Attending: Emergency Medicine | Admitting: Emergency Medicine

## 2017-10-25 ENCOUNTER — Emergency Department (HOSPITAL_COMMUNITY): Payer: Medicare Other

## 2017-10-25 DIAGNOSIS — F1494 Cocaine use, unspecified with cocaine-induced mood disorder: Secondary | ICD-10-CM | POA: Diagnosis not present

## 2017-10-25 DIAGNOSIS — M542 Cervicalgia: Secondary | ICD-10-CM | POA: Diagnosis present

## 2017-10-25 DIAGNOSIS — F1020 Alcohol dependence, uncomplicated: Secondary | ICD-10-CM | POA: Diagnosis not present

## 2017-10-25 LAB — I-STAT CHEM 8, ED
BUN: 22 mg/dL — ABNORMAL HIGH (ref 6–20)
CALCIUM ION: 1.15 mmol/L (ref 1.15–1.40)
CHLORIDE: 102 mmol/L (ref 101–111)
Creatinine, Ser: 1.3 mg/dL — ABNORMAL HIGH (ref 0.61–1.24)
GLUCOSE: 101 mg/dL — AB (ref 65–99)
HCT: 46 % (ref 39.0–52.0)
Hemoglobin: 15.6 g/dL (ref 13.0–17.0)
Potassium: 3.9 mmol/L (ref 3.5–5.1)
SODIUM: 140 mmol/L (ref 135–145)
TCO2: 26 mmol/L (ref 22–32)

## 2017-10-25 LAB — RAPID URINE DRUG SCREEN, HOSP PERFORMED
AMPHETAMINES: NOT DETECTED
BARBITURATES: NOT DETECTED
Benzodiazepines: NOT DETECTED
Cocaine: POSITIVE — AB
Opiates: NOT DETECTED
TETRAHYDROCANNABINOL: POSITIVE — AB

## 2017-10-25 LAB — SALICYLATE LEVEL: Salicylate Lvl: <7 mg/dL (ref 2.8–30.0)

## 2017-10-25 LAB — COMPREHENSIVE METABOLIC PANEL
ALBUMIN: 4.9 g/dL (ref 3.5–5.0)
ALK PHOS: 40 U/L (ref 38–126)
ALT: 20 U/L (ref 17–63)
AST: 32 U/L (ref 15–41)
Anion gap: 13 (ref 5–15)
BUN: 22 mg/dL — ABNORMAL HIGH (ref 6–20)
CALCIUM: 9.9 mg/dL (ref 8.9–10.3)
CHLORIDE: 103 mmol/L (ref 101–111)
CO2: 25 mmol/L (ref 22–32)
CREATININE: 1.27 mg/dL — AB (ref 0.61–1.24)
GFR calc Af Amer: >60 mL/min (ref >60)
GFR calc non Af Amer: >60 mL/min (ref >60)
Glucose, Bld: 101 mg/dL — ABNORMAL HIGH (ref 65–99)
Potassium: 4 mmol/L (ref 3.5–5.1)
SODIUM: 141 mmol/L (ref 135–145)
Total Bilirubin: 1 mg/dL (ref 0.3–1.2)
Total Protein: 8 g/dL (ref 6.5–8.1)

## 2017-10-25 LAB — CBC WITH DIFFERENTIAL/PLATELET
BASOS ABS: 0 10*3/uL (ref 0.0–0.1)
Basophils Relative: 0 %
EOS ABS: 0 10*3/uL (ref 0.0–0.7)
Eosinophils Relative: 0 %
HEMATOCRIT: 41.5 % (ref 39.0–52.0)
HEMOGLOBIN: 14.2 g/dL (ref 13.0–17.0)
Lymphocytes Relative: 19 %
Lymphs Abs: 2.3 10*3/uL (ref 0.7–4.0)
MCH: 30.3 pg (ref 26.0–34.0)
MCHC: 34.2 g/dL (ref 30.0–36.0)
MCV: 88.7 fL (ref 78.0–100.0)
MONO ABS: 1.5 10*3/uL — AB (ref 0.1–1.0)
Monocytes Relative: 12 %
NEUTROS ABS: 8.6 10*3/uL — AB (ref 1.7–7.7)
NEUTROS PCT: 69 %
Platelets: 178 10*3/uL (ref 150–400)
RBC: 4.68 MIL/uL (ref 4.22–5.81)
RDW: 13.9 % (ref 11.5–15.5)
WBC: 12.4 10*3/uL — ABNORMAL HIGH (ref 4.0–10.5)

## 2017-10-25 LAB — ETHANOL: Alcohol, Ethyl (B): <10 mg/dL (ref <10)

## 2017-10-25 LAB — ACETAMINOPHEN LEVEL

## 2017-10-25 MED ORDER — SODIUM CHLORIDE 0.9 % IV BOLUS (SEPSIS)
500.0000 mL | Freq: Once | INTRAVENOUS | Status: AC
  Administered 2017-10-25: 500 mL via INTRAVENOUS

## 2017-10-25 MED ORDER — IOPAMIDOL (ISOVUE-300) INJECTION 61%
INTRAVENOUS | Status: AC
  Filled 2017-10-25: qty 75

## 2017-10-25 MED ORDER — SODIUM CHLORIDE 0.9 % IJ SOLN
INTRAMUSCULAR | Status: AC
  Filled 2017-10-25: qty 50

## 2017-10-25 MED ORDER — ACETAMINOPHEN 325 MG PO TABS
650.0000 mg | ORAL_TABLET | ORAL | Status: DC | PRN
  Administered 2017-10-25: 650 mg via ORAL
  Filled 2017-10-25: qty 2

## 2017-10-25 MED ORDER — ALUM & MAG HYDROXIDE-SIMETH 200-200-20 MG/5ML PO SUSP: 30.0000 mL | Freq: Four times a day (QID) | ORAL | Status: DC | PRN

## 2017-10-25 MED ORDER — IOPAMIDOL (ISOVUE-300) INJECTION 61%
75.0000 mL | Freq: Once | INTRAVENOUS | Status: AC | PRN
  Administered 2017-10-25: 75 mL via INTRAVENOUS

## 2017-10-25 MED ORDER — NICOTINE 21 MG/24HR TD PT24: 21.0000 mg | MEDICATED_PATCH | Freq: Every day | TRANSDERMAL | Status: DC

## 2017-10-25 MED ORDER — ZOLPIDEM TARTRATE 5 MG PO TABS: 5.0000 mg | ORAL_TABLET | Freq: Every evening | ORAL | Status: DC | PRN

## 2017-10-25 MED ORDER — ONDANSETRON HCL 4 MG PO TABS: 4.0000 mg | ORAL_TABLET | Freq: Three times a day (TID) | ORAL | Status: DC | PRN

## 2017-10-25 NOTE — ED Notes (Signed)
Bed: [facility]4 Expected date:  Expected time:  Means of arrival:  Comments: 

## 2017-10-25 NOTE — Patient Outreach (Signed)
[facility] met with the patient and provided substance use recovery support. Patient is interested in residential/outpatient substance use treatment. CPSS provided several different substance use treatment resources including an outpatient/residential treatment list, NA/AA meeting list, and [facility]PSS contact information. CPSS encouraged the patient to contact [facility]PSS at anytime for substance use recovery support or help with resources.
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
## 2018-01-03 ENCOUNTER — Other Ambulatory Visit (HOSPITAL_COMMUNITY): Payer: Self-pay

## 2018-01-03 ENCOUNTER — Other Ambulatory Visit: Payer: Self-pay

## 2018-01-03 ENCOUNTER — Emergency Department (HOSPITAL_COMMUNITY): Payer: Medicare Other

## 2018-01-03 ENCOUNTER — Encounter (HOSPITAL_COMMUNITY): Payer: Self-pay | Admitting: Emergency Medicine

## 2018-01-03 ENCOUNTER — Emergency Department (HOSPITAL_COMMUNITY)
Admission: EM | Admit: 2018-01-03 | Discharge: 2018-01-03 | Disposition: A | Discharge status: Home / Self Care | Payer: Medicare Other | Attending: Emergency Medicine | Admitting: Emergency Medicine

## 2018-01-03 DIAGNOSIS — F17210 Nicotine dependence, cigarettes, uncomplicated: Secondary | ICD-10-CM | POA: Diagnosis not present

## 2018-01-03 DIAGNOSIS — F1910 Other psychoactive substance abuse, uncomplicated: Secondary | ICD-10-CM | POA: Insufficient documentation

## 2018-01-03 DIAGNOSIS — F10929 Alcohol use, unspecified with intoxication, unspecified: Secondary | ICD-10-CM | POA: Diagnosis present

## 2018-01-03 DIAGNOSIS — F1010 Alcohol abuse, uncomplicated: Secondary | ICD-10-CM | POA: Insufficient documentation

## 2018-01-03 LAB — COMPREHENSIVE METABOLIC PANEL
ALBUMIN: 4.9 g/dL (ref 3.5–5.0)
ALT: 22 U/L (ref 17–63)
AST: 24 U/L (ref 15–41)
Alkaline Phosphatase: 41 U/L (ref 38–126)
Anion gap: 12 (ref 5–15)
BUN: 17 mg/dL (ref 6–20)
CALCIUM: 9.9 mg/dL (ref 8.9–10.3)
CO2: 26 mmol/L (ref 22–32)
CREATININE: 1.26 mg/dL — AB (ref 0.61–1.24)
Chloride: 102 mmol/L (ref 101–111)
GFR calc non Af Amer: >60 mL/min (ref >60)
GLUCOSE: 104 mg/dL — AB (ref 65–99)
Potassium: 4.8 mmol/L (ref 3.5–5.1)
SODIUM: 140 mmol/L (ref 135–145)
Total Bilirubin: 0.7 mg/dL (ref 0.3–1.2)
Total Protein: 8.4 g/dL — ABNORMAL HIGH (ref 6.5–8.1)

## 2018-01-03 LAB — CBC
HEMATOCRIT: 44.7 % (ref 39.0–52.0)
HEMOGLOBIN: 14.6 g/dL (ref 13.0–17.0)
MCH: 30.1 pg (ref 26.0–34.0)
MCHC: 32.7 g/dL (ref 30.0–36.0)
MCV: 92.2 fL (ref 78.0–100.0)
Platelets: 196 10*3/uL (ref 150–400)
RBC: 4.85 MIL/uL (ref 4.22–5.81)
RDW: 14.5 % (ref 11.5–15.5)
WBC: 20 10*3/uL — ABNORMAL HIGH (ref 4.0–10.5)

## 2018-01-03 LAB — I-STAT TROPONIN, ED
Troponin i, poc: 0.01 ng/mL (ref 0.00–0.08)
Troponin i, poc: 0.01 ng/mL (ref 0.00–0.08)

## 2018-01-03 LAB — ACETAMINOPHEN LEVEL: Acetaminophen (Tylenol), Serum: <10 ug/mL — ABNORMAL LOW (ref 10–30)

## 2018-01-03 LAB — ETHANOL: Alcohol, Ethyl (B): <10 mg/dL (ref <10)

## 2018-01-03 LAB — SALICYLATE LEVEL: Salicylate Lvl: <7 mg/dL (ref 2.8–30.0)

## 2018-01-03 MED ORDER — CHLORDIAZEPOXIDE HCL 25 MG PO CAPS: ORAL_CAPSULE | ORAL | 0 refills | Status: AC

## 2018-01-03 MED ORDER — SODIUM CHLORIDE 0.9 % IV BOLUS
1000.0000 mL | Freq: Once | INTRAVENOUS | Status: AC
  Administered 2018-01-03: 1000 mL via INTRAVENOUS

## 2018-01-03 NOTE — Patient Outreach (Signed)
CPSS met with the patient and provided substance use recovery support. Patient was interested in substance use support group meetings and residential substance use treatment. CPSS talked to the patient about [facility] and [facility] as possibilities for residential treatment. CPSS also talked to the patient about NA/AA meetings with providing a meeting list and talked to the patient about the times/locations of meetings in [place]. CPSS also provided CPSS contact information and  residential/outpatient substance use treatment list. CPSS encouraged the patient to contact CPSS for further substance use recovery support or help with substance use recovery resources.

## 2018-01-03 NOTE — ED Notes (Signed)
Asked for urine sample, pt stated he had to use restroom and would provide one. When pt was done and returned to bed, pt stated he did not collect a urine sample but did use restroom.

## 2018-01-03 NOTE — Discharge Instructions (Signed)
Take medication as prescribed to help with alcohol detox. Use the resources provided by peers support specialist to help with detox.

## 2018-01-03 NOTE — ED Notes (Signed)
Patient transported to X-ray 

## 2018-01-03 NOTE — ED Notes (Addendum)
Pt is requesting something to eat and drink. Will check with ED provider to ensure pt is able to.

## 2018-01-03 NOTE — ED Notes (Signed)
Bed: WA33 Expected date:  Expected time:  Means of arrival:  Comments: 

## 2018-01-03 NOTE — ED Provider Notes (Signed)
[facility]-EMERGENCY DEPT Provider Note   CSN: [id number] Arrival date & time: 01/03/18  0356     History   Chief Complaint Chief Complaint  Patient presents with  . Addiction Problem    HPI Carl Orr is a 32 y.o. male with a past medical history of polysubstance abuse, alcohol abuse, deafness, who presents to ED for evaluation of requesting detox for his addiction, suicidal ideations, cough and chest pain.  Patient states that he is began having the dry cough and generalized chest pain since yesterday.  Patient reports daily alcohol and substance use.  When asked about which substances, he is states "everything."  When asked about suicidal ideations, he states "maybe, anything can happen."  States he tried detoxing last year.  Last alcoholic beverage was yesterday.  He denies any shortness of breath, HI, AVH, prior MI, DVT or PE, fever, leg swelling, history of seizures, palpitations. Patient is deaf but is able to read lips.  HPI  Past Medical History:  Diagnosis Date  . Deaf     Patient Active Problem List   Diagnosis Date Noted  . Alcohol use disorder, moderate, dependence (HCC) 02/27/2016    Past Surgical History:  Procedure Laterality Date  . ABDOMINAL SURGERY     at birth  . KNEE SURGERY    . TESTICLE TORSION REDUCTION          Home Medications    Prior to Admission medications   Medication Sig Start Date End Date Taking? Authorizing Provider  cephALEXin (KEFLEX) 500 MG capsule Take 1 capsule (500 mg total) by mouth 4 (four) times daily. Patient not taking: Reported on 10/25/2017 06/05/17   Hedges, Tinnie Gens, PA-C  chlordiazePOXIDE (LIBRIUM) 25 MG capsule  PO TID x 1D, then 25-50mg  PO BID X 1D, then 25-50mg  PO QD X 1D 01/03/18   Lorie Cleckley, PA-C  hydrOXYzine (ATARAX/VISTARIL) 25 MG tablet Take 1 tablet (25 mg total) by mouth every 6 (six) hours as needed for anxiety. Patient not taking: Reported on 10/25/2017 03/01/16   Adonis Brook, NP     Family History History reviewed. No pertinent family history.  Social History Social History   Tobacco Use  . Smoking status: Current Every Day Smoker    Packs/day: 0.50    Types: Cigarettes  . Smokeless tobacco: Never Used  Substance Use Topics  . Alcohol use: No  . Drug use: Yes    Types: Cocaine, Marijuana     Allergies   Hydrocodone   Review of Systems Review of Systems  Constitutional: Negative for appetite change, chills and fever.  HENT: Negative for ear pain, rhinorrhea, sneezing and sore throat.   Eyes: Negative for photophobia and visual disturbance.  Respiratory: Positive for cough. Negative for chest tightness, shortness of breath and wheezing.   Cardiovascular: Positive for chest pain. Negative for palpitations.  Gastrointestinal: Negative for abdominal pain, blood in stool, constipation, diarrhea, nausea and vomiting.  Genitourinary: Negative for dysuria, hematuria and urgency.  Musculoskeletal: Negative for myalgias.  Skin: Negative for rash.  Neurological: Negative for dizziness, weakness and light-headedness.  Psychiatric/Behavioral: Positive for suicidal ideas. Negative for decreased concentration and hallucinations. The patient is not hyperactive.      Physical Exam Updated Vital Signs BP 114/64   Pulse (!) 56   Temp 98.1 F (36.7 C) (Oral)   Resp 18   Ht  (1.651 m)   Wt 54.4 kg (120 lb)   SpO2 100%   BMI 19.97 kg/m   Physical  Exam  Constitutional: He appears well-developed and well-nourished. No distress.  Nontoxic-appearing and in no acute distress.  HENT:  Head: Normocephalic and atraumatic.  Nose: Nose normal.  Eyes: Conjunctivae and EOM are normal. Right eye exhibits no discharge. Left eye exhibits no discharge. No scleral icterus.  Neck: Normal range of motion. Neck supple.  Cardiovascular: Normal rate, regular rhythm, normal heart sounds and intact distal pulses. Exam reveals no gallop and no friction rub.  No murmur  heard. Pulmonary/Chest: Effort normal and breath sounds normal. No respiratory distress.  Abdominal: Soft. Bowel sounds are normal. He exhibits no distension. There is no tenderness. There is no guarding.  Musculoskeletal: Normal range of motion. He exhibits no edema.  Neurological: He is alert. He exhibits normal muscle tone. Coordination normal.  Skin: Skin is warm and dry. No rash noted.  Psychiatric: He has a normal mood and affect. He is withdrawn. He expresses suicidal ideation. He expresses no homicidal ideation. He expresses no suicidal plans and no homicidal plans.  Nursing note and vitals reviewed.    ED Treatments / Results  Labs (all labs ordered are listed, but only abnormal results are displayed) Labs Reviewed  COMPREHENSIVE METABOLIC PANEL - Abnormal; Notable for the following components:      Result Value   Glucose, Bld 104 (*)    Creatinine, Ser 1.26 (*)    Total Protein 8.4 (*)    All other components within normal limits  ACETAMINOPHEN LEVEL - Abnormal; Notable for the following components:   Acetaminophen (Tylenol), Serum <10 (*)    All other components within normal limits  CBC - Abnormal; Notable for the following components:   WBC 20.0 (*)    All other components within normal limits  ETHANOL  SALICYLATE LEVEL  I-STAT TROPONIN, ED  I-STAT TROPONIN, ED    EKG None  Radiology Dg Chest 2 View  Result Date: 01/03/2018 CLINICAL DATA:  32 year old male with a history of cough EXAM: CHEST - 2 VIEW COMPARISON:  02/26/2016 FINDINGS: The heart size and mediastinal contours are within normal limits. Both lungs are clear. The visualized skeletal structures are unremarkable. IMPRESSION: No radiographic evidence of acute cardiopulmonary disease Electronically Signed   By: Gilmer Mor D.O.   On: 01/03/2018 09:34    Procedures Procedures (including critical care time)  Medications Ordered in ED Medications  sodium chloride 0.9 % bolus 1,000 mL (0 mLs  Intravenous Stopped 01/03/18 1255)     Initial Impression / Assessment and Plan / ED Course  I have reviewed the triage vital signs and the nursing notes.  Pertinent labs & imaging results that were available during my care of the patient were reviewed by me and considered in my medical decision making (see chart for details).     Patient presents to ED for evaluation of detox for addiction to "all drugs", alcohol.  He also does endorse suicidal ideations to me.  He also reports generalized chest pain but denies any shortness of breath, prior MI, DVT or PE, leg swelling, palpitations, HI, AVH.  On physical exam he is overall well-appearing.  He is afebrile.  He is not tachycardic, tachypneic or hypoxic.  EKG with no ischemic changes.  Initial and delta troponin were both negative.  Chest x-ray unremarkable.  Medical screening lab work including acetaminophen level, salicylate level, ethanol level, CBC and CMP unremarkable.  Spoke to TTS who cleared him psychiatrically and recommend peers support consult for resources regarding detox. Does not meet inpatient criteria. Patient was provided  with these materials.  He remains in NAD.  Symptoms improved with fluids given here.  Doubt cardiac or pulmonary cause of symptoms.  Advised to return to ED for any severe worsening symptoms.  Portions of this note were generated with Scientist, clinical (histocompatibility and immunogenetics). Dictation errors may occur despite best attempts at proofreading.   Final Clinical Impressions(s) / ED Diagnoses   Final diagnoses:  Polysubstance abuse (HCC)  Alcohol abuse    ED Discharge Orders        Ordered    chlordiazePOXIDE (LIBRIUM) 25 MG capsule     01/03/18 1331       Dietrich Pates, PA-C 01/03/18 1357    Benjiman Core, MD 01/03/18 [phone]

## 2018-01-03 NOTE — ED Notes (Signed)
Consult for peer support at bedside.
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
## 2018-01-03 NOTE — ED Notes (Signed)
TTS Consult at beside with patient.

## 2018-03-14 ENCOUNTER — Encounter (HOSPITAL_COMMUNITY): Payer: Self-pay

## 2018-03-14 ENCOUNTER — Emergency Department (HOSPITAL_COMMUNITY)
Admission: EM | Admit: 2018-03-14 | Discharge: 2018-03-14 | Disposition: A | Discharge status: Home / Self Care | Payer: Medicare Other | Attending: Emergency Medicine | Admitting: Emergency Medicine

## 2018-03-14 DIAGNOSIS — F10920 Alcohol use, unspecified with intoxication, uncomplicated: Secondary | ICD-10-CM | POA: Diagnosis not present

## 2018-03-14 DIAGNOSIS — Z23 Encounter for immunization: Secondary | ICD-10-CM | POA: Diagnosis not present

## 2018-03-14 DIAGNOSIS — Z79899 Other long term (current) drug therapy: Secondary | ICD-10-CM | POA: Insufficient documentation

## 2018-03-14 DIAGNOSIS — F17210 Nicotine dependence, cigarettes, uncomplicated: Secondary | ICD-10-CM | POA: Diagnosis not present

## 2018-03-14 DIAGNOSIS — M79601 Pain in right arm: Secondary | ICD-10-CM | POA: Insufficient documentation

## 2018-03-14 MED ORDER — BACITRACIN ZINC 500 UNIT/GM EX OINT
TOPICAL_OINTMENT | Freq: Two times a day (BID) | CUTANEOUS | Status: DC
  Administered 2018-03-14: 1 via TOPICAL
  Filled 2018-03-14: qty 0.9

## 2018-03-14 MED ORDER — TETANUS-DIPHTH-ACELL PERTUSSIS 5-2.5-18.5 LF-MCG/0.5 IM SUSP
0.5000 mL | Freq: Once | INTRAMUSCULAR | Status: AC
  Administered 2018-03-14: 0.5 mL via INTRAMUSCULAR
  Filled 2018-03-14: qty 0.5

## 2018-03-14 NOTE — ED Notes (Signed)
Cleaned wound and applied dressing to right arm

## 2018-03-14 NOTE — ED Notes (Signed)
Pt tolerated water with no complications
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
## 2018-03-14 NOTE — ED Notes (Signed)
Gave Pt sandwich and drink before departure. Pt ambulated out of facility. Vitals stable on departure

## 2018-03-14 NOTE — ED Provider Notes (Signed)
[facility]-EMERG[facility] DEPT Provider Note   CSN: [id number] Arrival date & time: 03/14/18  0034     History   Chief Complaint Chief Complaint  Patient presents with  . Alcohol Intoxication  . Arm Pain    right     HPI Matthew Reeves is a 32 y.o. male.  The history is provided by the patient.  Alcohol Intoxication  This is a recurrent problem. The current episode started 12 to 24 hours ago. The problem occurs constantly. The problem has not changed since onset.Pertinent negatives include no chest pain, no abdominal pain, no headaches and no shortness of breath. Nothing aggravates the symptoms. Nothing relieves the symptoms. He has tried nothing for the symptoms. The treatment provided no relief.  Arm Pain  Pertinent negatives include no chest pain, no abdominal pain, no headaches and no shortness of breath.    Past Medical History:  Diagnosis Date  . Alcohol abuse   . Deaf    congenital - HOH  . Polysubstance abuse [facility])     Patient Active Problem List   Diagnosis Date Noted  . Cocaine use disorder, moderate, dependence (HCC) 05/28/2017  . Alcohol use disorder, moderate, dependence (HCC) 05/28/2017  . MDD (major depressive disorder) 05/27/2017    History reviewed. No pertinent surgical history.      Home Medications    Prior to Admission medications   Medication Sig Start Date End Date Taking? Authorizing Provider  hydrOXYzine (ATARAX/VISTARIL) 25 MG tablet Take 1 tablet (25 mg) four times daily as needed: For anxiety. 05/30/17   Armandina Stammer I, NP  nicotine (NICODERM CQ - DOSED IN MG/24 HOURS) 21 mg/24hr patch Place 1 patch (21 mg total) onto the skin daily. For smoking cessation 05/31/17   Armandina Stammer I, NP  traZODone (DESYREL) 50 MG tablet Take 1 tablet (50 mg total) by mouth at bedtime as needed for sleep. 05/30/17   Sanjuana Kava, NP    Family History History reviewed. No pertinent family history.  Social History Social History    Tobacco Use  . Smoking status: Current Every Day Smoker    Packs/day: 1.00  . Smokeless tobacco: Never Used  Substance Use Topics  . Alcohol use: Yes  . Drug use: Not on file     Allergies   Patient has no known allergies.   Review of Systems Review of Systems  Constitutional: Negative for fever.  Eyes: Negative for photophobia.  Respiratory: Negative for shortness of breath.   Cardiovascular: Negative for chest pain, palpitations and leg swelling.  Gastrointestinal: Negative for abdominal pain.  Genitourinary: Negative for flank pain.  Skin:       abrasion  Neurological: Negative for headaches.  All other systems reviewed and are negative.    Physical Exam Updated Vital Signs BP 108/68 (BP Location: Left Arm)   Pulse 82   Temp 98.2 F (36.8 C) (Oral)   Resp 20   Ht 5\' 5"  (1.651 m)   Wt 54.4 kg (120 lb)   SpO2 99%   BMI 19.97 kg/m   Physical Exam  Constitutional: He is oriented to person, place, and time. He appears well-developed and well-nourished. No distress.  HENT:  Head: Normocephalic and atraumatic.  Mouth/Throat: No oropharyngeal exudate.  Eyes: Pupils are equal, round, and reactive to light.  Neck: Normal range of motion. Neck supple.  Cardiovascular: Normal rate, regular rhythm, normal heart sounds and intact distal pulses.  Pulmonary/Chest: Effort normal and breath sounds normal. No stridor. He  has no wheezes. He has no rales.  Abdominal: Soft. Bowel sounds are normal. He exhibits no mass. There is no tenderness. There is no rebound and no guarding.  Musculoskeletal: Normal range of motion.  Neurological: He is alert and oriented to person, place, and time.  Skin: Skin is warm and dry. Capillary refill takes less than 2 seconds.  Abrasion right forearm      ED Treatments / Results  Labs (all labs ordered are listed, but only abnormal results are displayed) Labs Reviewed - No data to display  EKG None  Radiology No results  found.  Procedures Procedures (including critical care time)    Final Clinical Impressions(s) / ED Diagnoses   PO challenged and ambulated successfully in the department.    Return for pain, numbness, changes in vision or speech, fevers >100.4 unrelieved by medication, shortness of breath, intractable vomiting, or diarrhea, abdominal pain, Inability to tolerate liquids or food, cough, altered mental status or any concerns. No signs of systemic illness or infection. The patient is nontoxic-appearing on exam and vital signs are within normal limits. Will refer to urology for microscopy hematuria as patient is asymptomatic.  I have reviewed the triage vital signs and the nursing notes. Pertinent labs &imaging results that were available during my care of the patient were reviewed by me and considered in my medical decision making (see chart for details).  After history, exam, and medical workup I feel the patient has been appropriately medically screened and is safe for discharge home. Pertinent diagnoses were discussed with the patient. Patient was given return precautions.   Melodye Swor, MD 03/14/18 [phone]

## 2018-03-14 NOTE — ED Notes (Signed)
Pt ambulated without any assistance.
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
## 2018-03-28 ENCOUNTER — Encounter (HOSPITAL_COMMUNITY): Payer: Self-pay | Admitting: Emergency Medicine

## 2018-03-28 ENCOUNTER — Other Ambulatory Visit: Payer: Self-pay

## 2018-03-28 ENCOUNTER — Emergency Department (HOSPITAL_COMMUNITY)
Admission: EM | Admit: 2018-03-28 | Discharge: 2018-03-29 | Disposition: A | Discharge status: Home / Self Care | Payer: Medicare Other | Attending: Emergency Medicine | Admitting: Emergency Medicine

## 2018-03-28 DIAGNOSIS — F17210 Nicotine dependence, cigarettes, uncomplicated: Secondary | ICD-10-CM | POA: Insufficient documentation

## 2018-03-28 DIAGNOSIS — E860 Dehydration: Secondary | ICD-10-CM | POA: Diagnosis not present

## 2018-03-28 DIAGNOSIS — F1910 Other psychoactive substance abuse, uncomplicated: Secondary | ICD-10-CM | POA: Diagnosis not present

## 2018-03-28 DIAGNOSIS — R7989 Other specified abnormal findings of blood chemistry: Secondary | ICD-10-CM

## 2018-03-28 DIAGNOSIS — R55 Syncope and collapse: Secondary | ICD-10-CM | POA: Diagnosis present

## 2018-03-28 LAB — BASIC METABOLIC PANEL
ANION GAP: 10 (ref 5–15)
BUN: 12 mg/dL (ref 6–20)
CO2: 23 mmol/L (ref 22–32)
Calcium: 9.6 mg/dL (ref 8.9–10.3)
Chloride: 109 mmol/L (ref 98–111)
Creatinine, Ser: 1.26 mg/dL — ABNORMAL HIGH (ref 0.61–1.24)
GFR calc Af Amer: >60 mL/min (ref >60)
GLUCOSE: 67 mg/dL — AB (ref 70–99)
Potassium: 3.9 mmol/L (ref 3.5–5.1)
Sodium: 142 mmol/L (ref 135–145)

## 2018-03-28 LAB — CBC
HEMATOCRIT: 42.5 % (ref 39.0–52.0)
HEMOGLOBIN: 13.8 g/dL (ref 13.0–17.0)
MCH: 29.9 pg (ref 26.0–34.0)
MCHC: 32.5 g/dL (ref 30.0–36.0)
MCV: 92.2 fL (ref 78.0–100.0)
Platelets: 174 10*3/uL (ref 150–400)
RBC: 4.61 MIL/uL (ref 4.22–5.81)
RDW: 14.4 % (ref 11.5–15.5)
WBC: 9.9 10*3/uL (ref 4.0–10.5)

## 2018-03-28 LAB — CBG MONITORING, ED: GLUCOSE-CAPILLARY: 80 mg/dL (ref 70–99)

## 2018-03-28 MED ORDER — SODIUM CHLORIDE 0.9 % IV BOLUS
2000.0000 mL | Freq: Once | INTRAVENOUS | Status: AC
  Administered 2018-03-29: 2000 mL via INTRAVENOUS

## 2018-03-28 NOTE — ED Triage Notes (Signed)
Pt reports that he had a syncopal episode 1 hour PTA. A&O x 4, ambulatory without dizziness/lightheadedness at this time. Pt reports heavy ETOH and marijuana use today.

## 2018-03-28 NOTE — ED Notes (Signed)
Pt st's he is here because he wants help for his ETOH and drug use.

## 2018-03-29 DIAGNOSIS — E860 Dehydration: Secondary | ICD-10-CM | POA: Diagnosis not present

## 2018-03-29 LAB — URINALYSIS, ROUTINE W REFLEX MICROSCOPIC
Bilirubin Urine: NEGATIVE
GLUCOSE, UA: NEGATIVE mg/dL
Hgb urine dipstick: NEGATIVE
Ketones, ur: NEGATIVE mg/dL
LEUKOCYTES UA: NEGATIVE
NITRITE: NEGATIVE
PROTEIN: NEGATIVE mg/dL
Specific Gravity, Urine: 1.025 (ref 1.005–1.030)
pH: 5 (ref 5.0–8.0)

## 2018-03-29 LAB — RAPID URINE DRUG SCREEN, HOSP PERFORMED
Amphetamines: NOT DETECTED
Barbiturates: NOT DETECTED
Benzodiazepines: NOT DETECTED
Cocaine: POSITIVE — AB
OPIATES: NOT DETECTED
Tetrahydrocannabinol: POSITIVE — AB

## 2018-03-29 LAB — ACETAMINOPHEN LEVEL: Acetaminophen (Tylenol), Serum: <10 ug/mL — ABNORMAL LOW (ref 10–30)

## 2018-03-29 LAB — I-STAT TROPONIN, ED: Troponin i, poc: 0.01 ng/mL (ref 0.00–0.08)

## 2018-03-29 LAB — HEPATIC FUNCTION PANEL
ALBUMIN: 4.3 g/dL (ref 3.5–5.0)
ALT: 18 U/L (ref 0–44)
AST: 21 U/L (ref 15–41)
Alkaline Phosphatase: 41 U/L (ref 38–126)
Bilirubin, Direct: 0.1 mg/dL (ref 0.0–0.2)
Indirect Bilirubin: 0.8 mg/dL (ref 0.3–0.9)
TOTAL PROTEIN: 7.4 g/dL (ref 6.5–8.1)
Total Bilirubin: 0.9 mg/dL (ref 0.3–1.2)

## 2018-03-29 LAB — ETHANOL

## 2018-03-29 LAB — LIPASE, BLOOD: Lipase: 65 U/L — ABNORMAL HIGH (ref 11–51)

## 2018-03-29 LAB — SALICYLATE LEVEL

## 2018-03-29 NOTE — ED Notes (Signed)
Pt was able to tolerate sandwich and fluids.

## 2018-03-29 NOTE — ED Provider Notes (Signed)
[facility] EMERGENCY DEPARTMENT Provider Note   CSN: [id number] Arrival date & time: 03/28/18  1829     History   Chief Complaint Chief Complaint  Patient presents with  . Loss of Consciousness    HPI Matthew Reeves is a 32 y.o. male with a hx of alcohol abuse, cocaine abuse, depression, hard of hearing presents to the Emergency Department complaining of 2 episodes of syncope today along with requesting detox.  Patient reports that he was out in the heat, drinking heavily and using "a lot" of crack cocaine.  He reports that he felt lightheaded and was able to sit down both times.  He states he did not fall or hit his head.  He denies seizure activity.  He reports some chest pain earlier today after cocaine usage but this has resolved and has not returned.  Patient reports he did not have chest pain before either episode of syncope.  Patient denies headache, neck pain, neck stiffness, fevers, chills, shortness of breath, abdominal pain, vomiting, diarrhea, weakness, dizziness, dysuria, hematuria.  She denies recent illnesses, URI symptoms or cough.  Patient then states that his primary reason for presenting today as he wishes for detox from drugs and alcohol.  He reports that he does crack almost daily.  He states alcohol is a daily occurrence and today he has had 3-4 beers, most of them 40 ounce beers.  He reports that he feels sad about his drug usage but he denies suicidal or homicidal ideation.  He denies auditory or visual hallucinations.  He denies IV drug use.    The history is provided by the patient and medical records. No language interpreter was used.     HPI  Past Medical History:  Diagnosis Date  . Alcohol abuse   . Deaf    congenital - HOH  . Polysubstance abuse [facility])     Patient Active Problem List   Diagnosis Date Noted  . Cocaine use disorder, moderate, dependence (HCC) 05/28/2017  . Alcohol use disorder, moderate, dependence (HCC) 05/28/2017  .  MDD (major depressive disorder) 05/27/2017    History reviewed. No pertinent surgical history.      Home Medications    Prior to Admission medications   Not on File    Family History No family history on file.  Social History Social History   Tobacco Use  . Smoking status: Current Every Day Smoker    Packs/day: 1.00  . Smokeless tobacco: Never Used  Substance Use Topics  . Alcohol use: Yes  . Drug use: Not on file     Allergies   Patient has no known allergies.   Review of Systems Review of Systems Constitutional: Negative for appetite change, diaphoresis, fatigue, fever and unexpected weight change.  HENT: Negative for mouth sores.   Eyes: Negative for visual disturbance.  Respiratory: Negative for cough, chest tightness, shortness of breath and wheezing.   Cardiovascular: Positive for syncope. Negative for chest pain.  Gastrointestinal: Negative for abdominal pain, constipation, diarrhea, nausea and vomiting.  Endocrine: Negative for polydipsia, polyphagia and polyuria.  Genitourinary: Negative for dysuria, frequency, hematuria and urgency.  Musculoskeletal: Negative for back pain and neck stiffness.  Skin: Negative for rash.  Allergic/Immunologic: Negative for immunocompromised state.  Neurological: Positive for syncope. Negative for light-headedness and headaches.  Hematological: Does not bruise/bleed easily.  Psychiatric/Behavioral: Negative for sleep disturbance. The patient is not nervous/anxious.    Physical Exam Updated Vital Signs BP 107/69   Pulse (!) 52  Temp 98.3 F (36.8 C) (Oral)   Resp 17   Ht 5\' 10"  (1.778 m)   Wt 49.9 kg (110 lb)   SpO2 100%   BMI 15.78 kg/m   Physical Exam  Constitutional: He appears well-developed. No distress.  Awake, alert, malnourished  nontoxic appearance  HENT:  Head: Normocephalic and atraumatic.  Mouth/Throat: Oropharynx is clear and moist. No oropharyngeal exudate.  Eyes: Conjunctivae are normal. No  scleral icterus.  Neck: Normal range of motion. Neck supple.  Cardiovascular: Normal rate, regular rhythm and intact distal pulses.  Pulmonary/Chest: Effort normal and breath sounds normal. No respiratory distress. He has no wheezes.  Equal chest expansion  Abdominal: Soft. Bowel sounds are normal. He exhibits no mass. There is no tenderness. There is no rebound and no guarding.  Musculoskeletal: Normal range of motion. He exhibits no edema.  Neurological: He is alert. GCS eye subscore is 4. GCS verbal subscore is 5. GCS motor subscore is 6.  Speech is clear and goal oriented Moves extremities without ataxia Cranial nerves are grossly intact Sensation intact to normal touch in the bilateral upper and lower extremities 5/5 strength in the bilateral upper and lower extremities Normal gait  Skin: Skin is warm and dry. He is not diaphoretic.  Psychiatric: He is not actively hallucinating. He exhibits a depressed mood. He expresses no homicidal and no suicidal ideation. He expresses no suicidal plans and no homicidal plans.  Nursing note and vitals reviewed.    ED Treatments / Results  Labs (all labs ordered are listed, but only abnormal results are displayed) Labs Reviewed  BASIC METABOLIC PANEL - Abnormal; Notable for the following components:      Result Value   Glucose, Bld 67 (*)    Creatinine, Ser 1.26 (*)    All other components within normal limits  RAPID URINE DRUG SCREEN, HOSP PERFORMED - Abnormal; Notable for the following components:   Cocaine POSITIVE (*)    Tetrahydrocannabinol POSITIVE (*)    All other components within normal limits  ACETAMINOPHEN LEVEL - Abnormal; Notable for the following components:   Acetaminophen (Tylenol), Serum <10 (*)    All other components within normal limits  LIPASE, BLOOD - Abnormal; Notable for the following components:   Lipase 65 (*)    All other components within normal limits  CBC  URINALYSIS, ROUTINE W REFLEX MICROSCOPIC  ETHANOL   SALICYLATE LEVEL  HEPATIC FUNCTION PANEL  CBG MONITORING, ED  I-STAT TROPONIN, ED    EKG EKG Interpretation  Date/Time:  Saturday March 28 2018 18:32:47 EDT Ventricular Rate:  69 PR Interval:  136 QRS Duration: 82 QT Interval:  376 QTC Calculation: 402 R Axis:   68 Text Interpretation:  Normal sinus rhythm Normal ECG No significant change since last tracing Confirmed by Linwood Dibbles [phone]) on 03/29/2018 12:30:10 AM   Procedures Procedures (including critical care time)  Medications Ordered in ED Medications  sodium chloride 0.9 % bolus 2,000 mL (0 mLs Intravenous Stopped 03/29/18 0326)     Initial Impression / Assessment and Plan / ED Course  I have reviewed the triage vital signs and the nursing notes.  Pertinent labs & imaging results that were available during my care of the patient were reviewed by me and considered in my medical decision making (see chart for details).  Clinical Course as of Mar 29 326  Wynelle Link Mar 29, 2018  0112 Patient's creatinine is elevated.  I suspect some level of dehydration.  Fluids given.  Creatinine(!): 1.26 [HM]  Clinical Course User Index [HM] Jazzmyne Rasnick, Dahlia ClientHannah, [place]PA-C    Patient presents with 2 episodes of reported syncope.  He is alert and oriented here in the emergency department with a normal neurologic exam.  There is no clinical evidence of seizure activity and no reported evidence of seizure activity.  It appears as if these potential syncopal episodes were in relation to both heat, alcohol and drug usage today.  His EKG is nonischemic and his troponin is negative.  Patient's ethanol level is less than 10 and he is not currently in withdrawals.  No seizures here.  Lipase is slightly elevated at 65 however on exam, patient's abdomen is soft and nontender.  He has tolerated p.o. without difficulty.  No emesis.  Urinalysis without evidence of urinary tract infection.  No hypoglycemia.  Drug screen does show cocaine and marijuana.  Patient  is afebrile without tachycardia or hypotension.  No evidence of sepsis.  Patient is without orthostasis.  He does have an elevated serum creatinine however I suspect this is secondary to dehydration.  Fluids given.  Orthostatic VS for the past 24 hrs:  BP- Lying Pulse- Lying BP- Sitting Pulse- Sitting BP- Standing at 0 minutes Pulse- Standing at 0 minutes  03/29/18 0031 118/53 58 122/73 (!) 48 127/83 55   Additionally, patient is requesting detox from alcohol and drugs.  He reports his last detox was 3 months ago.  He denies suicidal or homicidal ideation.  No auditory or visual hallucinations.  Patient will be given outpatient resources for detox facilities.  I discussed these findings and plan for discharge home with patient.  He states understanding and is in agreement with this plan.  He has been referred to the wellness center for recheck of his serum creatinine.   Final Clinical Impressions(s) / ED Diagnoses   Final diagnoses:  Dehydration  Polysubstance abuse (HCC)  Syncope, unspecified syncope type  Elevated serum creatinine    ED Discharge Orders    None       Mardene SayerMuthersbaugh, Boyd KerbsHannah, PA-C 03/29/18 0327    Geoffery Lyonselo, [place], MD 03/30/18 [phone]

## 2018-03-29 NOTE — Discharge Instructions (Addendum)
1. Medications: usual home medications 2. Treatment: rest, drink plenty of fluids,  3. Follow Up: [facility] for discussion of your diagnoses and further evaluation after today's visit; if you do not have a primary care doctor use the resource guide provided to find one; Please return to the ER for recurrent symptoms, seizures, fevers or other concerns

## 2018-03-29 NOTE — ED Notes (Addendum)
PT states understanding of care given, follow up care. PT ambulated from ED to car with a steady gait.  

## 2018-03-29 NOTE — ED Notes (Signed)
Writer provided pt with a Mal[place]key sandwich and two cups of spirit.

## 2020-01-07 ENCOUNTER — Other Ambulatory Visit: Payer: Self-pay

## 2020-01-07 ENCOUNTER — Encounter (HOSPITAL_COMMUNITY): Payer: Self-pay

## 2020-01-07 ENCOUNTER — Emergency Department (HOSPITAL_COMMUNITY)
Admission: EM | Admit: 2020-01-07 | Discharge: 2020-01-08 | Disposition: A | Discharge status: Home / Self Care | Payer: Medicare Other | Attending: Emergency Medicine | Admitting: Emergency Medicine

## 2020-01-07 DIAGNOSIS — J069 Acute upper respiratory infection, unspecified: Secondary | ICD-10-CM | POA: Diagnosis not present

## 2020-01-07 DIAGNOSIS — Z79899 Other long term (current) drug therapy: Secondary | ICD-10-CM | POA: Diagnosis not present

## 2020-01-07 DIAGNOSIS — R05 Cough: Secondary | ICD-10-CM | POA: Diagnosis present

## 2020-01-07 DIAGNOSIS — Z20822 Contact with and (suspected) exposure to covid-19: Secondary | ICD-10-CM | POA: Insufficient documentation

## 2020-01-07 DIAGNOSIS — F17210 Nicotine dependence, cigarettes, uncomplicated: Secondary | ICD-10-CM | POA: Insufficient documentation

## 2020-01-07 NOTE — ED Triage Notes (Signed)
Pt arrives to ED w/ c/o cough and congestion since yesterday.

## 2020-01-08 ENCOUNTER — Emergency Department (HOSPITAL_COMMUNITY): Payer: Medicare Other

## 2020-01-08 ENCOUNTER — Other Ambulatory Visit: Payer: Self-pay

## 2020-01-08 DIAGNOSIS — J069 Acute upper respiratory infection, unspecified: Secondary | ICD-10-CM | POA: Diagnosis not present

## 2020-01-08 LAB — POC SARS CORONAVIRUS 2 AG -  ED: SARS Coronavirus 2 Ag: NEGATIVE

## 2020-01-08 LAB — SARS CORONAVIRUS 2 (TAT 6-24 HRS): SARS Coronavirus 2: NEGATIVE

## 2020-01-08 MED ORDER — DM-GUAIFENESIN ER 30-600 MG PO TB12
1.0000 | ORAL_TABLET | Freq: Two times a day (BID) | ORAL | 0 refills | Status: AC | PRN
Start: 2020-01-08 — End: ?

## 2020-01-08 MED ORDER — FLUTICASONE PROPIONATE 50 MCG/ACT NA SUSP: 2.0000 | Freq: Every day | NASAL | 2 refills | Status: AC

## 2020-01-08 NOTE — Discharge Instructions (Addendum)
.You were seen today for an upper respiratory infection. Use Flonase as directed for your congestion and Mucinex/Robitussin for your cough. Continue to stay well-hydrated. Continued to alternate between Tylenol and ibuprofen for pain. Followup with your primary care doctor in 5-7 days or referral for urgent care for recheck of ongoing symptoms however return to emergency department for emergent changing or worsening of symptoms- example: shortness of breath, chest pain, difficulty swallowing, muffled voice.Use the guide above as needed.    .Your COVID test is pending; you should expect results in 2-3 days. You can access your results on your MyChart--if you test positive you should receive a phone call.  In the meantime follow CDC guidelines and quarantine, wear a mask, wash hands often.   Please take over the counter vitamin D 2000-4000 units per day. I also recommend zinc 50 mg per day for the next two weeks.   Please return to ED if you feel have difficulty breathing or have emergent, new or concerning symptoms.  Patients who have symptoms consistent with COVID-19 should self isolated for: At least 3 days (72 hours) have passed since recovery, defined as resolution of fever without the use of fever reducing medications and improvement in respiratory symptoms (e.g., cough, shortness of breath), and At least 7 days have passed since symptoms first appeared.       Person Under Monitoring Name: Carl Orr  Location: [address] [place] [address]   Infection Prevention Recommendations for Individuals Confirmed to have, or Being Evaluated for, 2019 Novel Coronavirus (COVID-19) Infection Who Receive Care at Home  Individuals who are confirmed to have, or are being evaluated for, COVID-19 should follow the prevention steps below until a healthcare provider or local or state health department says they can return to normal activities.  Stay home except to get medical care You should  restrict activities outside your home, except for getting medical care. Do not go to work, school, or public areas, and do not use public transportation or taxis.  Call ahead before visiting your doctor Before your medical appointment, call the healthcare provider and tell them that you have, or are being evaluated for, COVID-19 infection. This will help the healthcare providers office take steps to keep other people from getting infected. Ask your healthcare provider to call the local or state health department.  Monitor your symptoms Seek prompt medical attention if your illness is worsening (e.g., difficulty breathing). Before going to your medical appointment, call the healthcare provider and tell them that you have, or are being evaluated for, COVID-19 infection. Ask your healthcare provider to call the local or state health department.  Wear a facemask You should wear a facemask that covers your nose and mouth when you are in the same room with other people and when you visit a healthcare provider. People who live with or visit you should also wear a facemask while they are in the same room with you.  Separate yourself from other people in your home As much as possible, you should stay in a different room from other people in your home. Also, you should use a separate bathroom, if available.  Avoid sharing household items You should not share dishes, drinking glasses, cups, eating utensils, towels, bedding, or other items with other people in your home. After using these items, you should wash them thoroughly with soap and water.  Cover your coughs and sneezes Cover your mouth and nose with a tissue when you cough or sneeze, or you  can cough or sneeze into your sleeve. Throw used tissues in a lined trash can, and immediately wash your hands with soap and water for at least 20 seconds or use an alcohol-based hand rub.  Wash your [employer] your hands often and thoroughly with  soap and water for at least 20 seconds. You can use an alcohol-based hand sanitizer if soap and water are not available and if your hands are not visibly dirty. Avoid touching your eyes, nose, and mouth with unwashed hands.   Prevention Steps for Caregivers and Household Members of Individuals Confirmed to have, or Being Evaluated for, COVID-19 Infection Being Cared for in the Home  If you live with, or provide care at home for, a person confirmed to have, or being evaluated for, COVID-19 infection please follow these guidelines to prevent infection:  Follow healthcare providers instructions Make sure that you understand and can help the patient follow any healthcare provider instructions for all care.  Provide for the patients basic needs You should help the patient with basic needs in the home and provide support for getting groceries, prescriptions, and other personal needs.  Monitor the patients symptoms If they are getting sicker, call his or her medical provider and tell them that the patient has, or is being evaluated for, COVID-19 infection. This will help the healthcare providers office take steps to keep other people from getting infected. Ask the healthcare provider to call the local or state health department.  Limit the number of people who have contact with the patient If possible, have only one caregiver for the patient. Other household members should stay in another home or place of residence. If this is not possible, they should stay in another room, or be separated from the patient as much as possible. Use a separate bathroom, if available. Restrict visitors who do not have an essential need to be in the home.  Keep older adults, very young children, and other sick people away from the patient Keep older adults, very young children, and those who have compromised immune systems or chronic health conditions away from the patient. This includes people with chronic  heart, lung, or kidney conditions, diabetes, and cancer.  Ensure good ventilation Make sure that shared spaces in the home have good air flow, such as from an air conditioner or an opened window, weather permitting.  Wash your hands often Wash your hands often and thoroughly with soap and water for at least 20 seconds. You can use an alcohol based hand sanitizer if soap and water are not available and if your hands are not visibly dirty. Avoid touching your eyes, nose, and mouth with unwashed hands. Use disposable paper towels to dry your hands. If not available, use dedicated cloth towels and replace them when they become wet.  Wear a facemask and gloves Wear a disposable facemask at all times in the room and gloves when you touch or have contact with the patients blood, body fluids, and/or secretions or excretions, such as sweat, saliva, sputum, nasal mucus, vomit, urine, or feces.  Ensure the mask fits over your nose and mouth tightly, and do not touch it during use. Throw out disposable facemasks and gloves after using them. Do not reuse. Wash your hands immediately after removing your facemask and gloves. If your personal clothing becomes contaminated, carefully remove clothing and launder. Wash your hands after handling contaminated clothing. Place all used disposable facemasks, gloves, and other waste in a lined container before disposing them with other  household waste. Remove gloves and wash your hands immediately after handling these items.  Do not share dishes, glasses, or other household items with the patient Avoid sharing household items. You should not share dishes, drinking glasses, cups, eating utensils, towels, bedding, or other items with a patient who is confirmed to have, or being evaluated for, COVID-19 infection. After the person uses these items, you should wash them thoroughly with soap and water.  Wash laundry thoroughly Immediately remove and wash clothes or  bedding that have blood, body fluids, and/or secretions or excretions, such as sweat, saliva, sputum, nasal mucus, vomit, urine, or feces, on them. Wear gloves when handling laundry from the patient. Read and follow directions on labels of laundry or clothing items and detergent. In general, wash and dry with the warmest temperatures recommended on the label.  Clean all areas the individual has used often Clean all touchable surfaces, such as counters, tabletops, doorknobs, bathroom fixtures, toilets, phones, keyboards, tablets, and bedside tables, every day. Also, clean any surfaces that may have blood, body fluids, and/or secretions or excretions on them. Wear gloves when cleaning surfaces the patient has come in contact with. Use a diluted bleach solution (e.g., dilute bleach with 1 part bleach and 10 parts water) or a household disinfectant with a label that says EPA-registered for coronaviruses. To make a bleach solution at home, add 1 tablespoon of bleach to 1 quart (4 cups) of water. For a larger supply, add  cup of bleach to 1 gallon (16 cups) of water. Read labels of cleaning products and follow recommendations provided on product labels. Labels contain instructions for safe and effective use of the cleaning product including precautions you should take when applying the product, such as wearing gloves or eye protection and making sure you have good ventilation during use of the product. Remove gloves and wash hands immediately after cleaning.  Monitor yourself for signs and symptoms of illness Caregivers and household members are considered close contacts, should monitor their health, and will be asked to limit movement outside of the home to the extent possible. Follow the monitoring steps for close contacts listed on the symptom monitoring form.   ? If you have additional questions, contact your local health department or call the epidemiologist on call at [phone] (available  24/7). ? This guidance is subject to change. For the most up-to-date guidance from [facility], please refer to their website: [email or web address]

## 2020-01-08 NOTE — ED Provider Notes (Signed)
[facility] EMERGENCY DEPARTMENT Provider Note   CSN: [id number] Arrival date & time: 01/07/20  2015     History Chief Complaint  Patient presents with  . Cough    Carl Orr is a 34 y.o. male with pertinent past medical history of polysubstance use disorder that presents to the emergency department today for cough and congestion with subjective fevers that started yesterday.  States that he has been spitting up some mucus with his cough, states that his nose is clogged up and he has not been able to smell.  States that he has been having fevers at home, has not taken his temperature.  Denies any other symptoms -no shortness of breath, chest pain, abdominal pain, back pain, neck pain, leg swelling, weakness, chills, paresthesias.  Denies any sore throat, earache, eye pain, sinus pain, headache.  Denies any sick contacts.  Has not gotten his Covid vaccines.  Is unsure if he has ever had Covid before. Does smoke tobacco daily.   HPI     Past Medical History:  Diagnosis Date  . Deaf     Patient Active Problem List   Diagnosis Date Noted  . Alcohol use disorder, moderate, dependence ([place]) 02/27/2016    Past Surgical History:  Procedure Laterality Date  . ABDOMINAL SURGERY     at birth  . KNEE SURGERY    . TESTICLE TORSION REDUCTION         No family history on file.  Social History   Tobacco Use  . Smoking status: Current Every Day Smoker    Packs/day: 0.50    Types: Cigarettes  . Smokeless tobacco: Never Used  Substance Use Topics  . Alcohol use: No  . Drug use: Yes    Types: Cocaine, Marijuana    Home Medications Prior to Admission medications   Medication Sig Start Date End Date Taking? Authorizing Provider  cephALEXin (KEFLEX) 500 MG capsule Take 1 capsule (500 mg total) by mouth 4 (four) times daily. Patient not taking: Reported on 10/25/2017 06/05/17   Hedges, Dellis Filbert, PA-C  chlordiazePOXIDE (LIBRIUM) 25 MG capsule 50mg  PO TID x 1D, then  25-50mg  PO BID X 1D, then 25-50mg  PO QD X 1D 01/03/18   Khatri, Hina, PA-C  dextromethorphan-guaiFENesin (MUCINEX DM) 30-600 MG 12hr tablet Take 1 tablet by mouth 2 (two) times daily as needed for cough. 01/08/20   Alfredia Client, PA-C  fluticasone (FLONASE) 50 MCG/ACT nasal spray Place 2 sprays into both nostrils daily. While symptoms last. 01/08/20   Alfredia Client, PA-C  hydrOXYzine (ATARAX/VISTARIL) 25 MG tablet Take 1 tablet (25 mg total) by mouth every 6 (six) hours as needed for anxiety. Patient not taking: Reported on 10/25/2017 03/01/16   Kerrie Buffalo, NP    Allergies    Hydrocodone  Review of Systems   Review of Systems  Constitutional: Negative for chills, diaphoresis, fatigue and fever.  HENT: Positive for congestion and rhinorrhea. Negative for drooling, ear pain, facial swelling, sinus pressure, sinus pain, sneezing, sore throat and trouble swallowing.   Eyes: Negative for pain and visual disturbance.  Respiratory: Positive for cough. Negative for chest tightness, shortness of breath and wheezing.   Cardiovascular: Negative for chest pain, palpitations and leg swelling.  Gastrointestinal: Negative for abdominal distention, abdominal pain, diarrhea, nausea and vomiting.  Genitourinary: Negative for difficulty urinating.  Musculoskeletal: Negative for back pain, myalgias, neck pain and neck stiffness.  Skin: Negative for color change, pallor, rash and wound.  Neurological: Negative for dizziness, syncope, speech difficulty,  weakness, light-headedness, numbness and headaches.  Psychiatric/Behavioral: Negative for behavioral problems and confusion.    Physical Exam Updated Vital Signs BP 118/80 (BP Location: Right Arm)   Pulse 62   Temp 98.2 F (36.8 C) (Oral)   Resp 16   SpO2 100%   Physical Exam .Physical Exam  Constitutional: Pt  appears well-developed and well-nourished. No distress.  HENT:  Head: Normocephalic and atraumatic.  Nose: Nose normal. No mucosal edema or  rhinorrhea.  Mouth/Throat: Uvula is midline and mucous membranes are normal. Mucous membranes are not dry. No trismus in the jaw. No uvula swelling. Tonsils without exudate 2+.  No tonsillar abscesses.  Eyes: Conjunctivae are normal, Normal EOMS Neck: Normal range of motion, full passive range of motion without pain and phonation normal. No tracheal tenderness, no spinous process tenderness and no muscular tenderness present. No rigidity.  No Brudzinski's sign and no Kernig's sign noted.  Handling secretions without difficulty No nuchal rigidity or meningeal signs  Cardiovascular: Normal rate, regular rhythm and normal heart sounds.   Pulses:      Radial pulses are 2+ on the right side, and 2+ on the left side.  Pulmonary/Chest: Effort normal and breath sounds normal. No stridor. No respiratory distress. No decreased breath sounds. Occasional expiratory wheezes present on exhalation with some rales which clear after pt coughs, no rhonchi. Musculoskeletal: Normal range of motion.  Lymphadenopathy: No cervical adenopathy bilaterally  Neurological: Pt is alert and oriented and is able to move all extremities without ataxia.  Skin: Skin is warm and dry. Pt is not diaphoretic.  Psychiatric: Normal mood and affect.  Nursing note and vitals reviewed.  ED Results / Procedures / Treatments   Labs (all labs ordered are listed, but only abnormal results are displayed) Labs Reviewed  SARS CORONAVIRUS 2 (TAT 6-24 HRS)  POC SARS CORONAVIRUS 2 AG -  ED    EKG None  Radiology DG Chest 2 View  Result Date: 01/08/2020 CLINICAL DATA:  Cough and congestion, abnormal lung sounds. EXAM: CHEST - 2 VIEW COMPARISON:  Chest x-ray dated 01/03/2018. FINDINGS: The heart size and mediastinal contours are within normal limits. Both lungs are clear. The visualized skeletal structures are unremarkable. IMPRESSION: No active cardiopulmonary disease. No evidence of pneumonia or pulmonary edema. Electronically Signed    By: Bary Richard M.D.   On: 01/08/2020 09:19    Procedures Procedures (including critical care time)  Medications Ordered in ED Medications - No data to display  ED Course  I have reviewed the triage vital signs and the nursing notes.  Pertinent labs & imaging results that were available during my care of the patient were reviewed by me and considered in my medical decision making (see chart for details).    MDM Rules/Calculators/A&P                     Carl Orr is a 34 y.o. male with pertinent past medical history of polysubstance use disorder that presents to the emergency department today for cough and congestion with subjective fevers that started yesterday. Pt with normal vitals, afebrie, exam with adventitious lung sounds that clear after cough. Will obtain CXR to r/o PNA. No chest pain or SOB.  CXR without evidence of cardiopulmonary disease. COVID POC swab negative. Will obtain PCR. Pt with most likely URI, stable and will be discharged with Flonase and Mucinex. Educated pt on s/e and proper use. Educated pt on getting rest and staying hydrated and close follow up if  symptoms dont get better.  .Doubt need for further emergent work up at this time. I explained the diagnosis and have given explicit precautions to return to the ER including for any other new or worsening symptoms. The patient understands and accepts the medical plan as it's been dictated and I have answered their questions. Discharge instructions concerning home care and prescriptions have been given. The patient is STABLE and is discharged to home in good condition.  .I discussed this case with my attending physician, Dr. Rush Landmark,  who cosigned this note including patient's presenting symptoms, physical exam, and planned diagnostics and interventions. Attending physician stated agreement with plan or made changes to plan which were implemented.    Final Clinical Impression(s) / ED Diagnoses Final diagnoses:    Viral URI with cough    Rx / DC Orders ED Discharge Orders         Ordered    fluticasone (FLONASE) 50 MCG/ACT nasal spray  Daily     01/08/20 0928    dextromethorphan-guaiFENesin (MUCINEX DM) 30-600 MG 12hr tablet  2 times daily PRN     01/08/20 0928           Farrel Gordon, PA-C 01/08/20 1751    Tegeler, Canary Brim, MD 01/08/20 [phone]

## 2020-07-02 IMAGING — CR DG CHEST 2V
2 series · 2 of 2 positions shown · non-contrast
Comparison: Chest x-ray dated 01/03/2018.

CLINICAL DATA: Cough and congestion, abnormal lung sounds.

EXAM:
CHEST - 2 VIEW

[chest pa]
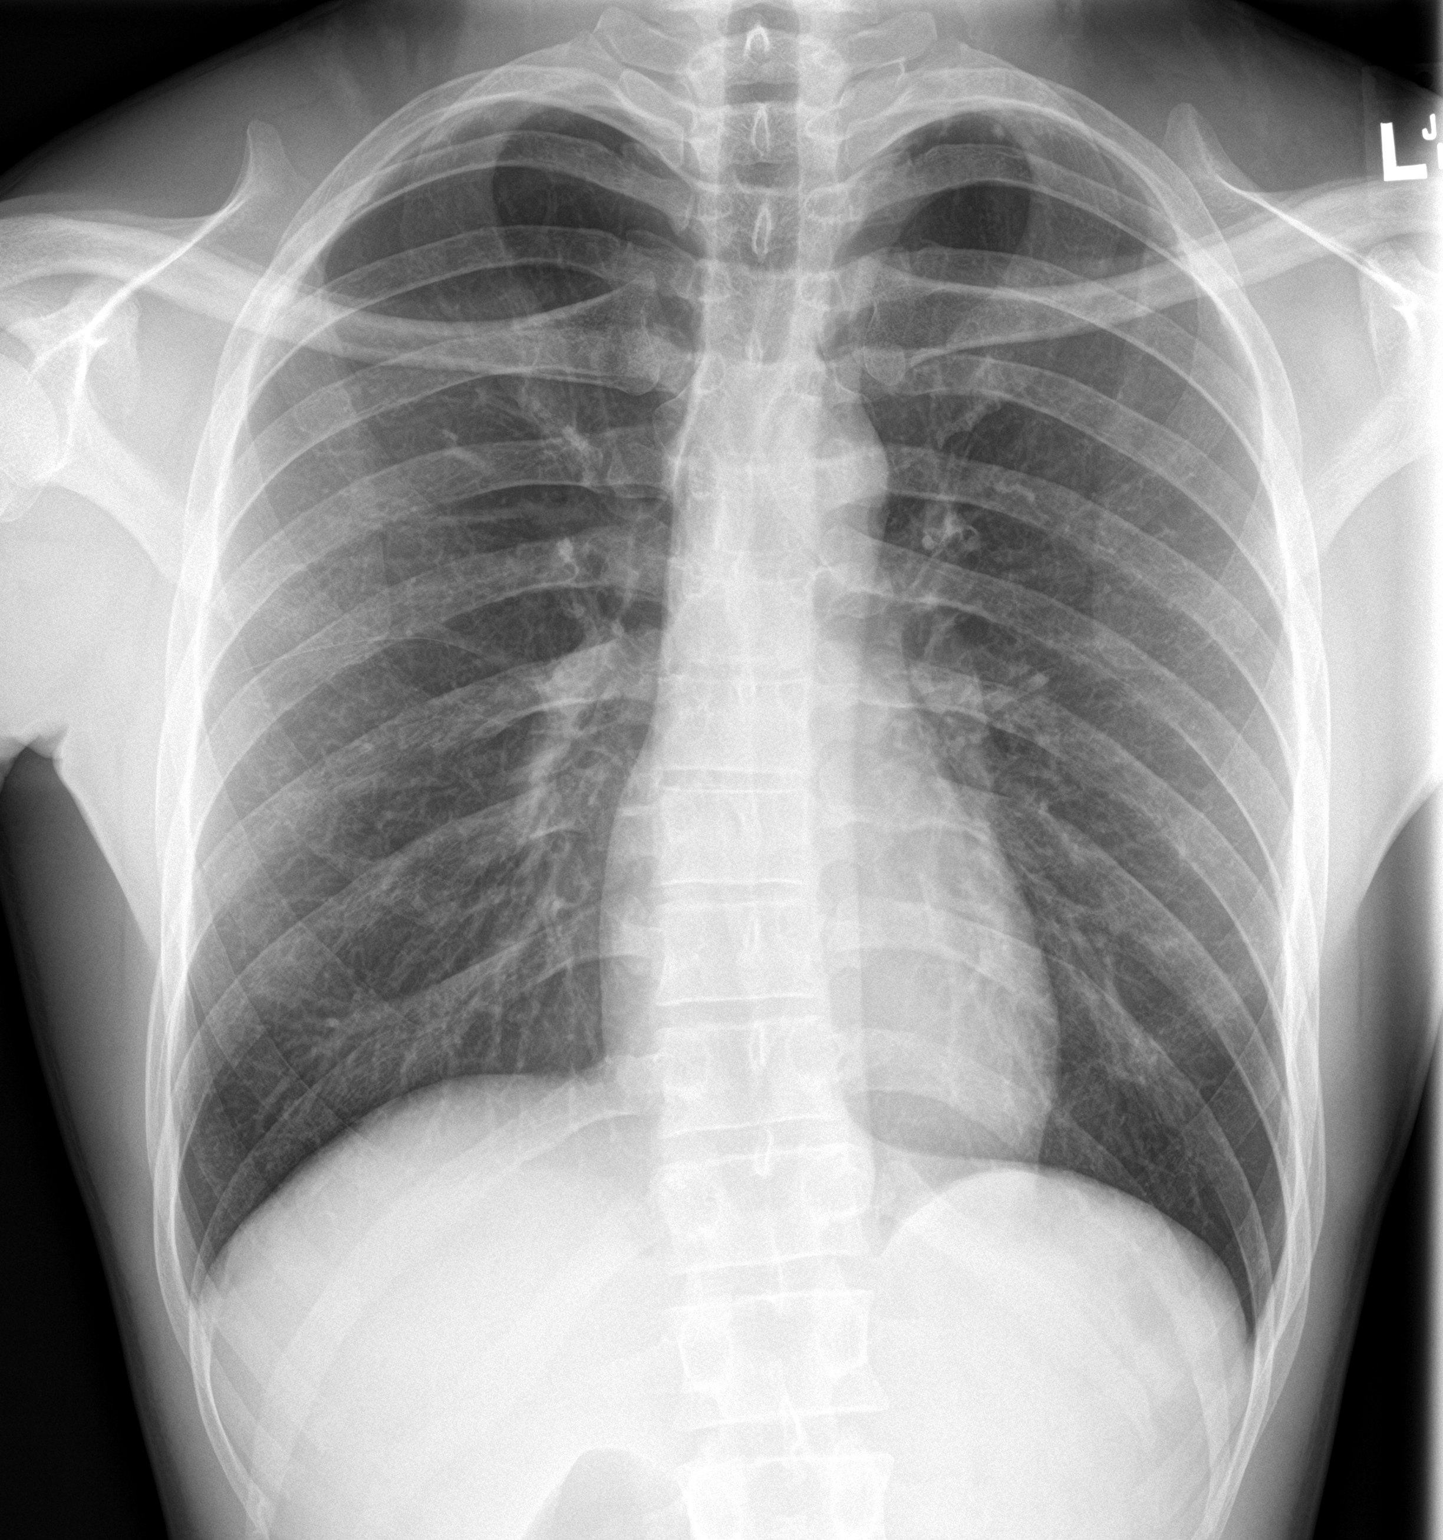

[chest lat]
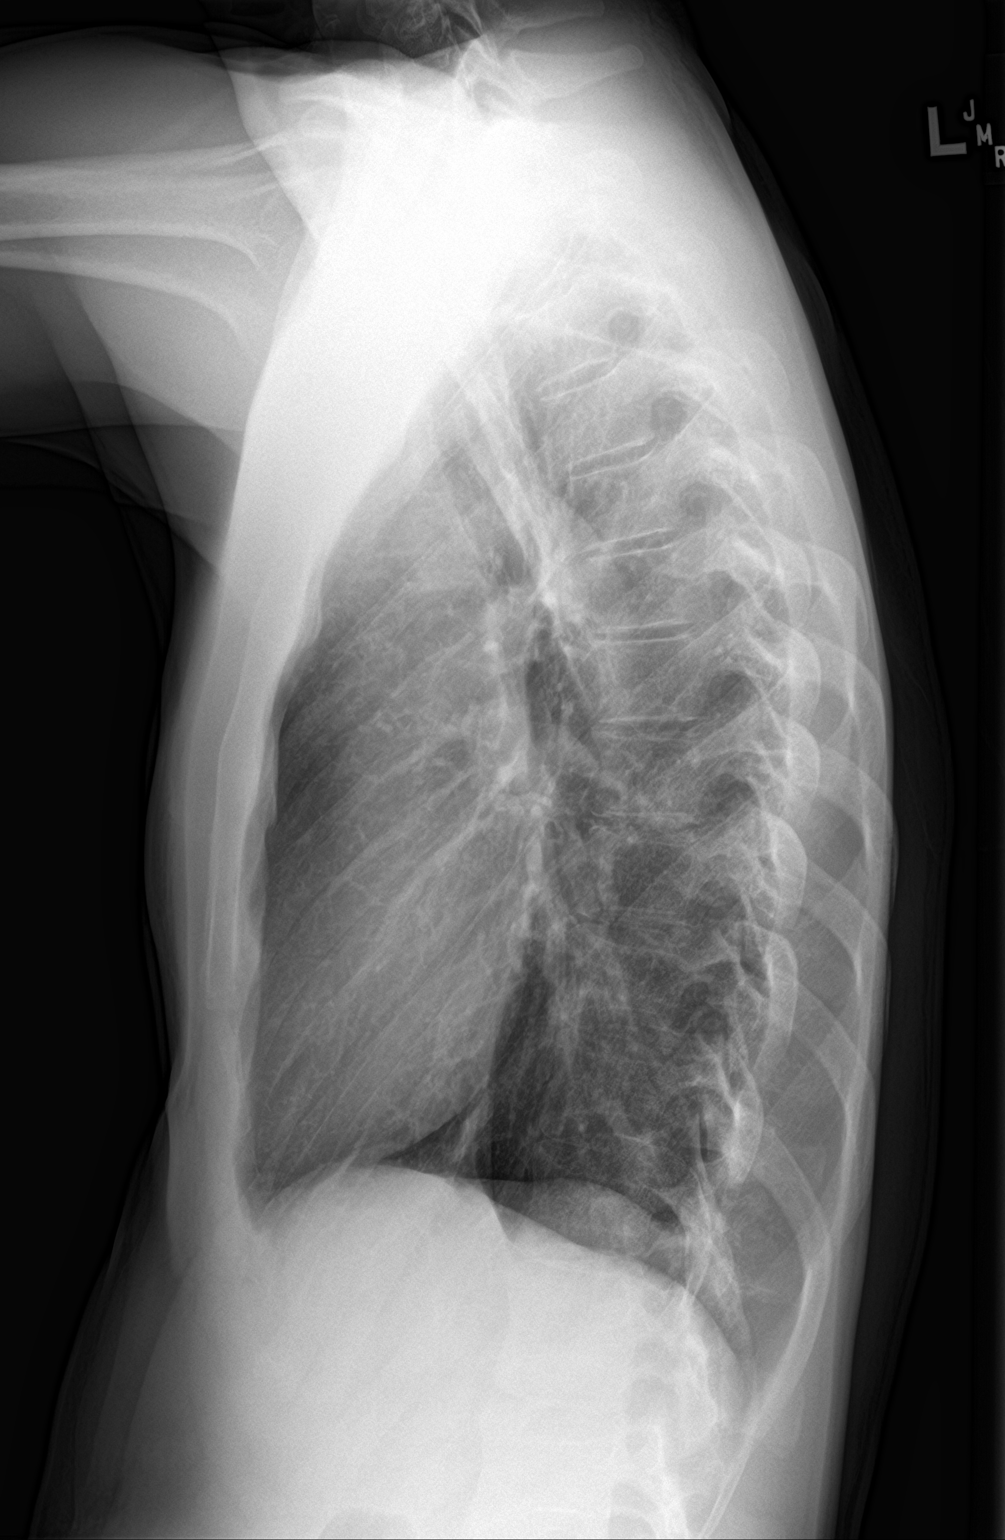

[2 of 2 positions shown; findings below may reference images not displayed]

FINDINGS: The heart size and mediastinal contours are within normal limits.
Both lungs are clear. The visualized skeletal structures are
unremarkable.
IMPRESSION: No active cardiopulmonary disease. No evidence of pneumonia or
pulmonary edema.
